# Patient Record
Sex: Female | Born: 1965 | Race: White | Hispanic: No | Marital: Married | Smoking: Former smoker
Health system: Southern US, Community
[De-identification: ages and names within clinical notes are randomized; demographics above are authoritative.]

## PROBLEM LIST (undated history)

## (undated) ENCOUNTER — Emergency Department (HOSPITAL_COMMUNITY): Payer: Self-pay | Source: Home / Self Care

## (undated) DIAGNOSIS — L03114 Cellulitis of left upper limb: Secondary | ICD-10-CM

## (undated) DIAGNOSIS — F32A Depression, unspecified: Secondary | ICD-10-CM

## (undated) DIAGNOSIS — F419 Anxiety disorder, unspecified: Secondary | ICD-10-CM

## (undated) DIAGNOSIS — I10 Essential (primary) hypertension: Secondary | ICD-10-CM

## (undated) DIAGNOSIS — K219 Gastro-esophageal reflux disease without esophagitis: Secondary | ICD-10-CM

## (undated) DIAGNOSIS — F329 Major depressive disorder, single episode, unspecified: Secondary | ICD-10-CM

## (undated) HISTORY — PX: NO PAST SURGERIES: SHX2092

---
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
## 1997-09-28 ENCOUNTER — Encounter: Admission: RE | Admit: 1997-09-28 | Discharge: 1997-09-28 | Payer: Self-pay | Admitting: Sports Medicine

## 1998-02-22 ENCOUNTER — Emergency Department (HOSPITAL_COMMUNITY): Admission: EM | Admit: 1998-02-22 | Discharge: 1998-02-22 | Payer: Self-pay | Admitting: Emergency Medicine

## 1998-02-22 ENCOUNTER — Encounter: Payer: Self-pay | Admitting: Emergency Medicine

## 1998-05-20 ENCOUNTER — Emergency Department (HOSPITAL_COMMUNITY): Admission: EM | Admit: 1998-05-20 | Discharge: 1998-05-20 | Payer: Self-pay | Admitting: Emergency Medicine

## 1998-08-19 ENCOUNTER — Encounter: Admission: RE | Admit: 1998-08-19 | Discharge: 1998-08-19 | Payer: Self-pay | Admitting: Family Medicine

## 1998-11-11 ENCOUNTER — Emergency Department (HOSPITAL_COMMUNITY): Admission: EM | Admit: 1998-11-11 | Discharge: 1998-11-11 | Payer: Self-pay | Admitting: Emergency Medicine

## 1998-12-26 ENCOUNTER — Encounter: Admission: RE | Admit: 1998-12-26 | Discharge: 1998-12-26 | Payer: Self-pay | Admitting: Family Medicine

## 1999-01-24 ENCOUNTER — Encounter: Admission: RE | Admit: 1999-01-24 | Discharge: 1999-01-24 | Payer: Self-pay | Admitting: Family Medicine

## 1999-04-05 ENCOUNTER — Encounter: Payer: Self-pay | Admitting: *Deleted

## 1999-04-05 ENCOUNTER — Emergency Department (HOSPITAL_COMMUNITY): Admission: EM | Admit: 1999-04-05 | Discharge: 1999-04-05 | Payer: Self-pay | Admitting: *Deleted

## 2000-01-05 ENCOUNTER — Emergency Department (HOSPITAL_COMMUNITY): Admission: EM | Admit: 2000-01-05 | Discharge: 2000-01-05 | Payer: Self-pay | Admitting: Emergency Medicine

## 2001-03-18 ENCOUNTER — Encounter: Admission: RE | Admit: 2001-03-18 | Discharge: 2001-03-18 | Payer: Self-pay | Admitting: Family Medicine

## 2001-03-18 ENCOUNTER — Encounter (INDEPENDENT_AMBULATORY_CARE_PROVIDER_SITE_OTHER): Payer: Self-pay | Admitting: Specialist

## 2003-11-21 ENCOUNTER — Emergency Department (HOSPITAL_COMMUNITY): Admission: EM | Admit: 2003-11-21 | Discharge: 2003-11-21 | Payer: Self-pay | Admitting: Emergency Medicine

## 2003-12-25 ENCOUNTER — Emergency Department (HOSPITAL_COMMUNITY): Admission: EM | Admit: 2003-12-25 | Discharge: 2003-12-25 | Payer: Self-pay | Admitting: Emergency Medicine

## 2005-11-09 ENCOUNTER — Encounter: Payer: Self-pay | Admitting: Family Medicine

## 2005-11-09 ENCOUNTER — Ambulatory Visit: Payer: Self-pay | Admitting: Family Medicine

## 2005-11-09 ENCOUNTER — Encounter (INDEPENDENT_AMBULATORY_CARE_PROVIDER_SITE_OTHER): Payer: Self-pay | Admitting: *Deleted

## 2005-11-09 LAB — CONVERTED CEMR LAB

## 2005-12-02 ENCOUNTER — Ambulatory Visit (HOSPITAL_COMMUNITY): Admission: RE | Admit: 2005-12-02 | Discharge: 2005-12-02 | Payer: Self-pay | Admitting: Family Medicine

## 2006-04-08 DIAGNOSIS — K644 Residual hemorrhoidal skin tags: Secondary | ICD-10-CM | POA: Insufficient documentation

## 2006-04-08 DIAGNOSIS — K649 Unspecified hemorrhoids: Secondary | ICD-10-CM | POA: Insufficient documentation

## 2006-04-08 DIAGNOSIS — G5600 Carpal tunnel syndrome, unspecified upper limb: Secondary | ICD-10-CM

## 2006-04-08 DIAGNOSIS — K625 Hemorrhage of anus and rectum: Secondary | ICD-10-CM

## 2006-04-08 DIAGNOSIS — F329 Major depressive disorder, single episode, unspecified: Secondary | ICD-10-CM | POA: Insufficient documentation

## 2006-04-08 DIAGNOSIS — M545 Low back pain: Secondary | ICD-10-CM | POA: Insufficient documentation

## 2006-04-09 ENCOUNTER — Encounter (INDEPENDENT_AMBULATORY_CARE_PROVIDER_SITE_OTHER): Payer: Self-pay | Admitting: *Deleted

## 2006-07-29 ENCOUNTER — Emergency Department (HOSPITAL_COMMUNITY): Admission: EM | Admit: 2006-07-29 | Discharge: 2006-07-29 | Payer: Self-pay | Admitting: Emergency Medicine

## 2006-08-26 ENCOUNTER — Encounter (INDEPENDENT_AMBULATORY_CARE_PROVIDER_SITE_OTHER): Payer: Self-pay | Admitting: Family Medicine

## 2006-08-26 ENCOUNTER — Ambulatory Visit: Payer: Self-pay | Admitting: Family Medicine

## 2006-08-26 ENCOUNTER — Telehealth: Payer: Self-pay | Admitting: *Deleted

## 2006-08-26 LAB — CONVERTED CEMR LAB
Basophils Relative: 0 % (ref 0–1)
Bilirubin Urine: NEGATIVE
Eosinophils Absolute: 0.1 10*3/uL (ref 0.0–0.7)
Eosinophils Relative: 1 % (ref 0–5)
Glucose, Urine, Semiquant: NEGATIVE
Ketones, urine, test strip: NEGATIVE
MCHC: 31.8 g/dL (ref 30.0–36.0)
MCV: 98.3 fL (ref 78.0–100.0)
Neutrophils Relative %: 71 % (ref 43–77)
Nitrite: NEGATIVE
Platelets: 294 10*3/uL (ref 150–400)
RBC / HPF: >25
RDW: 12.7 % (ref 11.5–14.0)
Urobilinogen, UA: 0.2

## 2006-08-30 ENCOUNTER — Telehealth (INDEPENDENT_AMBULATORY_CARE_PROVIDER_SITE_OTHER): Payer: Self-pay | Admitting: Family Medicine

## 2006-09-01 ENCOUNTER — Encounter (INDEPENDENT_AMBULATORY_CARE_PROVIDER_SITE_OTHER): Payer: Self-pay | Admitting: *Deleted

## 2007-06-27 ENCOUNTER — Encounter: Payer: Self-pay | Admitting: Family Medicine

## 2007-06-27 ENCOUNTER — Ambulatory Visit: Payer: Self-pay | Admitting: Family Medicine

## 2007-06-27 DIAGNOSIS — N898 Other specified noninflammatory disorders of vagina: Secondary | ICD-10-CM | POA: Insufficient documentation

## 2007-06-27 DIAGNOSIS — N949 Unspecified condition associated with female genital organs and menstrual cycle: Secondary | ICD-10-CM | POA: Insufficient documentation

## 2007-06-27 DIAGNOSIS — E049 Nontoxic goiter, unspecified: Secondary | ICD-10-CM | POA: Insufficient documentation

## 2007-06-27 LAB — CONVERTED CEMR LAB
LDL Cholesterol: 76 mg/dL (ref 0–99)
TSH: 1.451 microintl units/mL (ref 0.350–5.50)
Total CHOL/HDL Ratio: 2.8
Whiff Test: NEGATIVE

## 2007-07-08 ENCOUNTER — Ambulatory Visit (HOSPITAL_COMMUNITY): Admission: RE | Admit: 2007-07-08 | Discharge: 2007-07-08 | Payer: Self-pay | Admitting: Family Medicine

## 2007-07-25 ENCOUNTER — Encounter: Admission: RE | Admit: 2007-07-25 | Discharge: 2007-07-25 | Payer: Self-pay | Admitting: Family Medicine

## 2007-08-03 ENCOUNTER — Encounter: Admission: RE | Admit: 2007-08-03 | Discharge: 2007-08-03 | Payer: Self-pay | Admitting: Family Medicine

## 2007-09-14 ENCOUNTER — Ambulatory Visit: Payer: Self-pay | Admitting: Family Medicine

## 2007-09-20 ENCOUNTER — Emergency Department (HOSPITAL_COMMUNITY): Admission: EM | Admit: 2007-09-20 | Discharge: 2007-09-20 | Payer: Self-pay | Admitting: Emergency Medicine

## 2007-09-22 ENCOUNTER — Ambulatory Visit: Payer: Self-pay | Admitting: Family Medicine

## 2007-09-23 ENCOUNTER — Ambulatory Visit: Payer: Self-pay | Admitting: Family Medicine

## 2007-10-10 ENCOUNTER — Telehealth: Payer: Self-pay | Admitting: *Deleted

## 2008-03-27 ENCOUNTER — Telehealth: Payer: Self-pay | Admitting: Family Medicine

## 2008-03-27 ENCOUNTER — Ambulatory Visit: Payer: Self-pay | Admitting: Family Medicine

## 2008-04-03 ENCOUNTER — Telehealth (INDEPENDENT_AMBULATORY_CARE_PROVIDER_SITE_OTHER): Payer: Self-pay | Admitting: Family Medicine

## 2008-06-11 ENCOUNTER — Emergency Department (HOSPITAL_COMMUNITY): Admission: EM | Admit: 2008-06-11 | Discharge: 2008-06-11 | Payer: Self-pay | Admitting: Emergency Medicine

## 2008-12-28 ENCOUNTER — Other Ambulatory Visit: Admission: RE | Admit: 2008-12-28 | Discharge: 2008-12-28 | Payer: Self-pay | Admitting: Family Medicine

## 2008-12-28 ENCOUNTER — Ambulatory Visit: Payer: Self-pay | Admitting: Family Medicine

## 2008-12-28 LAB — CONVERTED CEMR LAB: Whiff Test: NEGATIVE

## 2009-01-01 ENCOUNTER — Encounter: Payer: Self-pay | Admitting: Family Medicine

## 2009-01-01 LAB — CONVERTED CEMR LAB
ALT: 17 units/L (ref 0–35)
Albumin: 4.7 g/dL (ref 3.5–5.2)
CO2: 24 meq/L (ref 19–32)
Calcium: 9.6 mg/dL (ref 8.4–10.5)
Chloride: 101 meq/L (ref 96–112)
Creatinine, Ser: 0.69 mg/dL (ref 0.40–1.20)
Potassium: 3.9 meq/L (ref 3.5–5.3)
Sodium: 137 meq/L (ref 135–145)
TSH: 2.254 microintl units/mL (ref 0.350–4.500)
Total Protein: 7.6 g/dL (ref 6.0–8.3)

## 2009-01-11 ENCOUNTER — Encounter: Admission: RE | Admit: 2009-01-11 | Discharge: 2009-01-11 | Payer: Self-pay | Admitting: Family Medicine

## 2009-01-31 IMAGING — US US PELVIS COMPLETE MODIFY
1 series · 14 of 25 positions shown · non-contrast
Comparison: None

CLINICAL DATA: Left pelvic discomfort.

TRANSABDOMINAL AND TRANSVAGINAL ULTRASOUND OF PELVIS
TECHNIQUE: Both transabdominal and transvaginal ultrasound
examinations of the pelvis were performed including evaluation of
the uterus, ovaries, adnexal regions, and pelvic cul-de-sac.

[Series 1: unknown · 0.30mm/px · 14 of 57 slices shown]
[im 1/57]
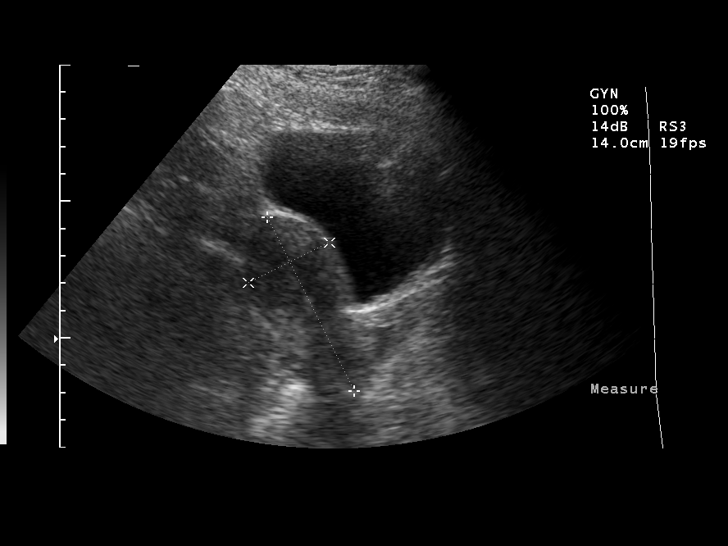
[im 5/57]
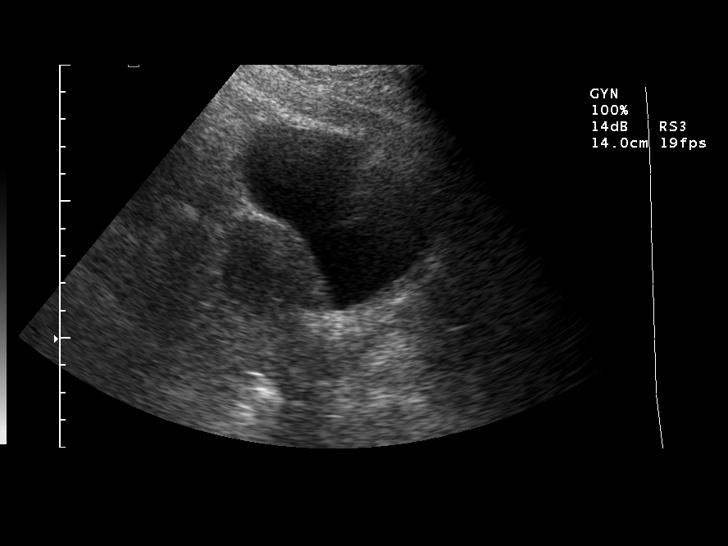
[im 10/57]
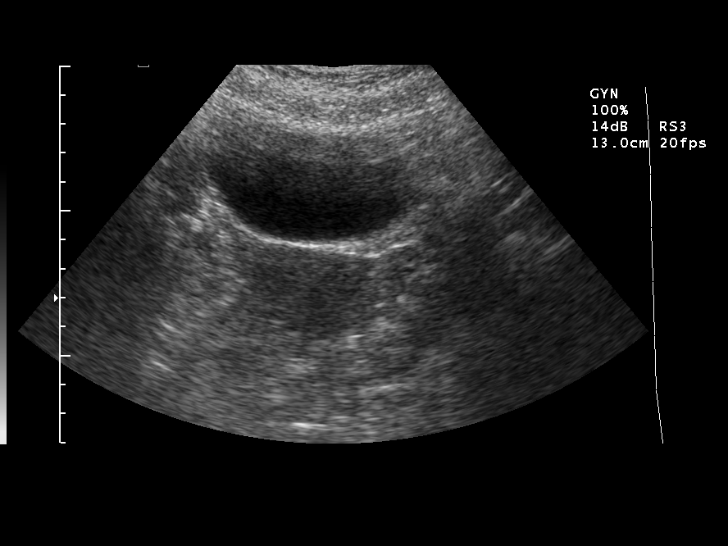
[im 15/57]
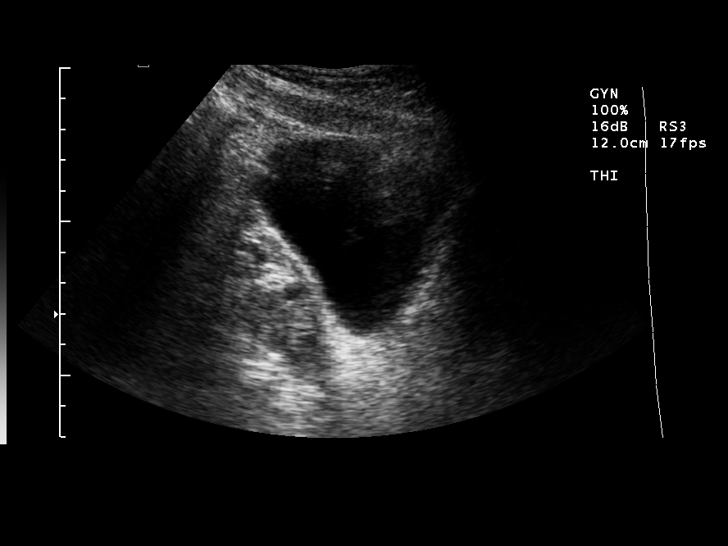
[im 19/57]
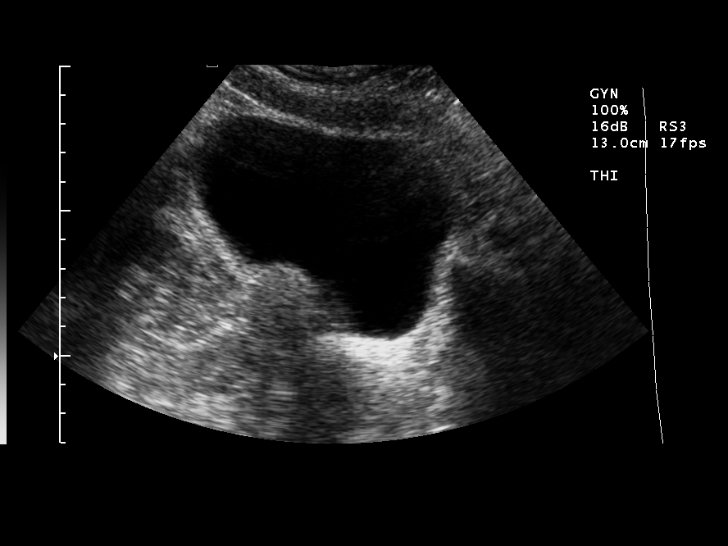
[im 22/57]
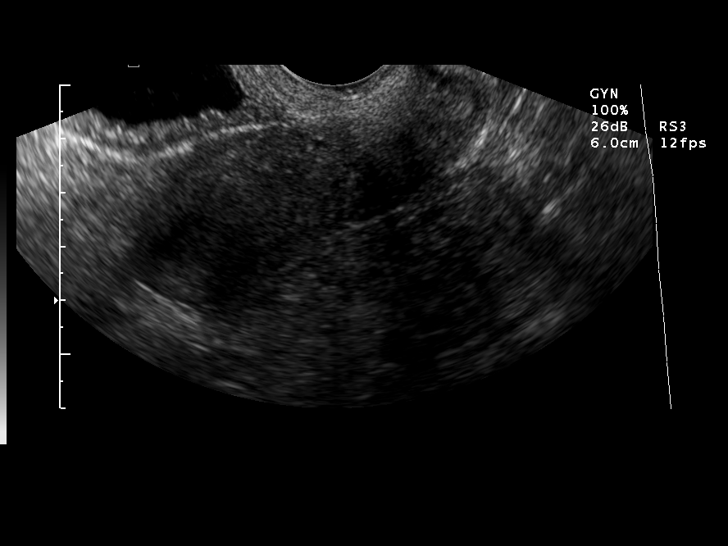
[im 26/57]
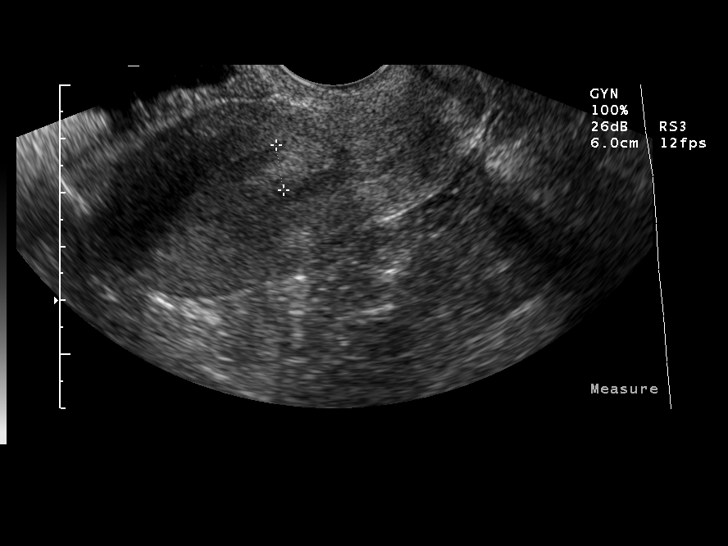
[im 31/57]
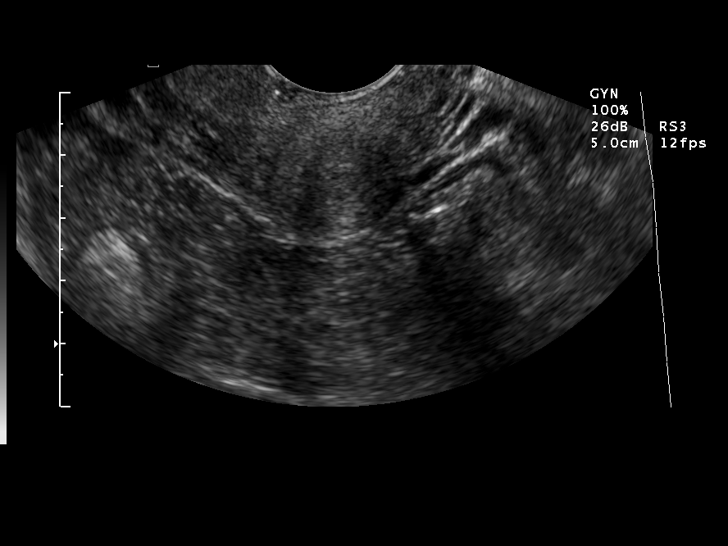
[im 36/57]
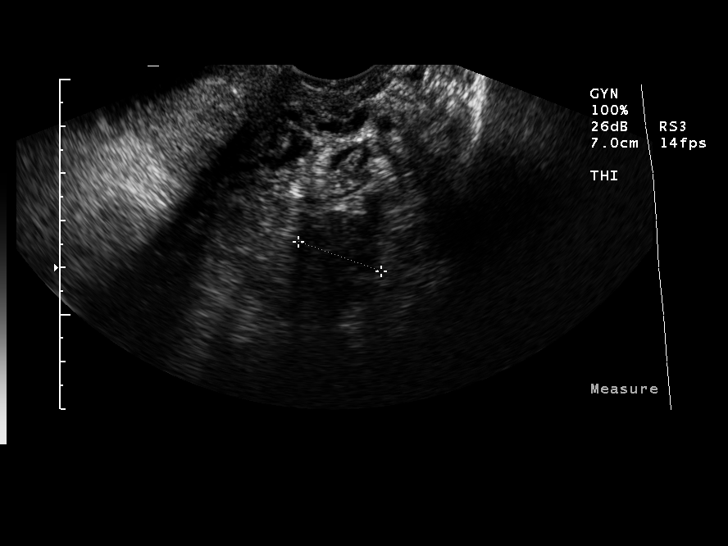
[im 38/57]
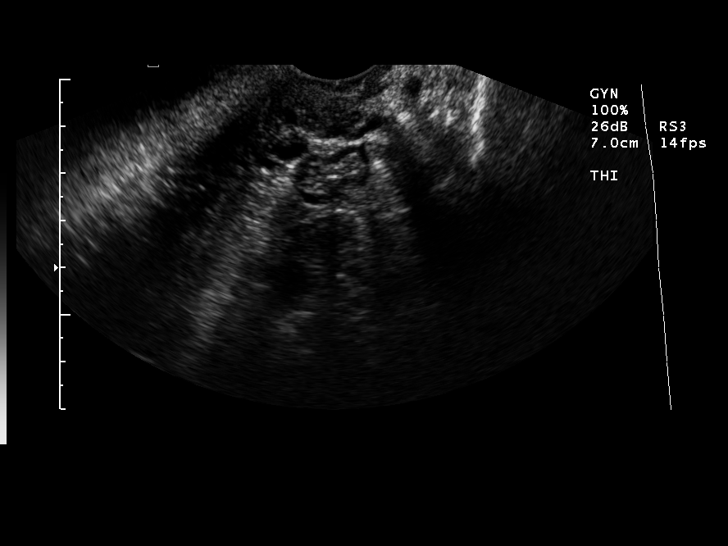
[im 43/57]
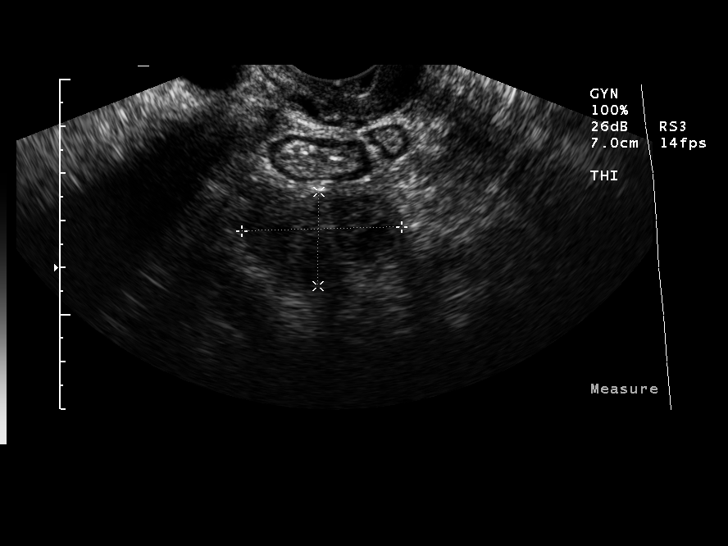
[im 47/57]
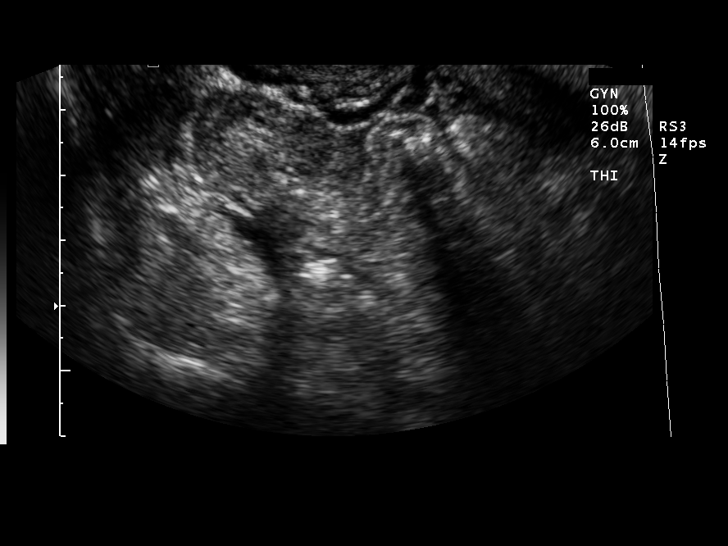
[im 52/57]
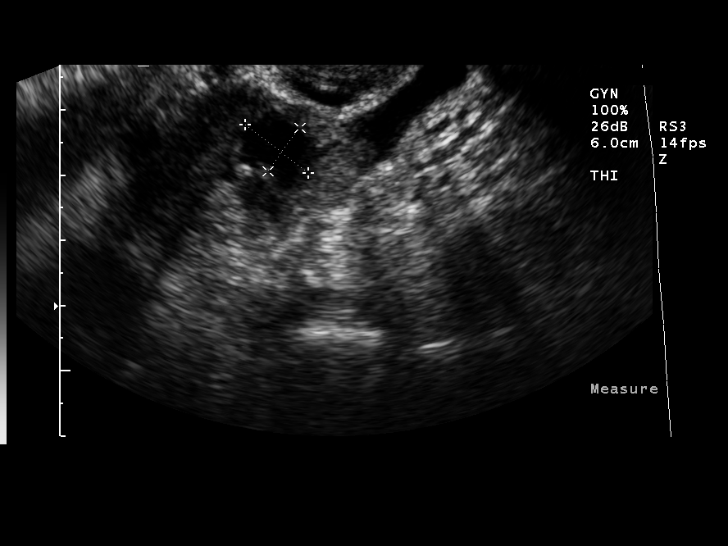
[im 57/57]
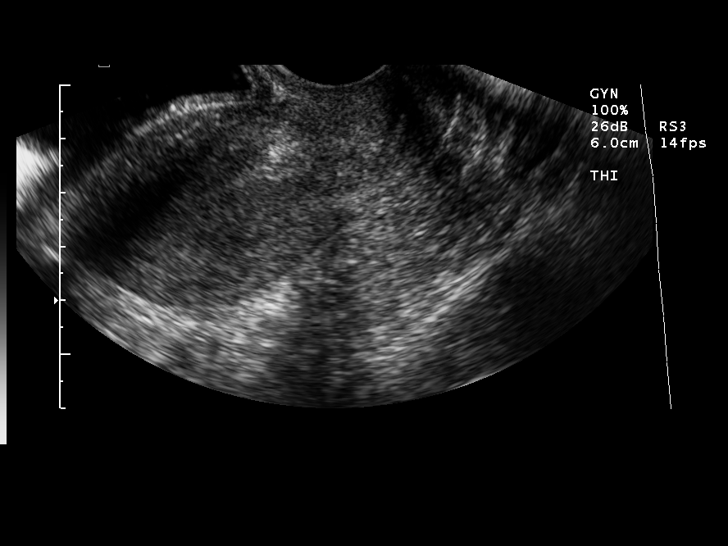

[14 of 25 positions shown; findings below may reference images not displayed]

FINDINGS: Uterus measures 7.4 x 3.4 x 4.4 cm.  Endometrial stripe
measures 3 mm in the uterine fundus and 9 mm in the lower uterine
segment.  Ovaries are unremarkable with a follicle on the right.
Small amount of free fluid.
IMPRESSION: 1.  Endometrial stripe is within normal limits for size.  Focal
prominence in the lower uterine segment is of questionable clinical
significance.  If there is a history of abnormal bleeding, sono
hysterography could be performed to evaluate for polyp, as
clinically indicated.
2.  Small free fluid.

## 2009-09-02 ENCOUNTER — Ambulatory Visit: Payer: Self-pay | Admitting: Family Medicine

## 2009-09-02 DIAGNOSIS — H9209 Otalgia, unspecified ear: Secondary | ICD-10-CM | POA: Insufficient documentation

## 2009-09-04 ENCOUNTER — Ambulatory Visit: Payer: Self-pay | Admitting: Family Medicine

## 2009-09-11 ENCOUNTER — Ambulatory Visit: Payer: Self-pay | Admitting: Family Medicine

## 2009-11-06 ENCOUNTER — Ambulatory Visit: Payer: Self-pay | Admitting: Family Medicine

## 2010-03-02 ENCOUNTER — Encounter: Payer: Self-pay | Admitting: Family Medicine

## 2010-03-11 NOTE — Assessment & Plan Note (Signed)
Summary: read ppd/bmc  Nurse Visit   Allergies: No Known Drug Allergies  PPD Results    Date of reading: 09/04/2009    Results: 0 mm    Interpretation: negative  Orders Added: 1)  No Charge Patient Arrived (NCPA0) [NCPA0]   BP was elevated at office visit on 09/02/2009 and patient requested recheck today.  BP checked manually using regular  adult cuff LA 120/88 RA 120/84. Brinda Focht RN  September 04, 2009 11:04 AM  Noted Doralee Albino MD  September 04, 2009 11:07 AM
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
## 2010-03-11 NOTE — Assessment & Plan Note (Signed)
Summary: TB TEST/EO  Nurse Visit   Allergies: No Known Drug Allergies  patient comes in today on nurse schedule for  PPD. she reports right ear discomfort, sore to touch, popping and stopped up sensation. appointment scheduled with Dr. Janalyn Harder in [facility] at 11:15. patient agreeable. Treacy Holcomb RN  September 02, 2009 10:12 AM   Immunizations Administered:  PPD Skin Test:    Vaccine Type: PPD    Site: right forearm    Mfr: Sanofi Pasteur    Dose: 0.1 ml    Route: ID    Given by: Theresia Lo RN    Exp. Date: 11/22/2010    Lot #: [id number]  Orders Added: 1)  TB Skin Test [86580] 2)  Admin 1st Vaccine [90471]  Appended Document: TB TEST/EO above was nurse visit and not an office visit. new  document was opened for office visit.
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
## 2010-03-11 NOTE — Assessment & Plan Note (Signed)
Summary: right ear discomfort, stopped up and popping/ls   Vital Signs:  Patient profile:   45 year old female Weight:      136.6 pounds Temp:     98.0 degrees F Pulse rate:   74 / minute BP sitting:   153 / 102  (left arm)  Vitals Entered By: Angeline Slim MD (September 02, 2009 11:03 AM) CC: ear pain Is Patient Diabetic? No Pain Assessment Patient in pain? yes     Location: rt ear Intensity: 3-10   Primary Care Provider:  Doralee Albino MD  CC:  ear pain.  History of Present Illness: 44 y/o F with   Intermitten R ear pain x 3 wks.  3 wks ago went swimming [employer].  Right afterwards feels like ear is popping.  Feels very painful.  Her mom had some ear drops so her mom gave her some drops.  She used this twice.  She had relief for about 1 week, but pain returned.  No ear discharge.  No fever, chills.  No HAs,  No nausea/vomiting.  Sore throat yesterday and now painful to swallow.  Pain kept her up last night.   Left ear with no pain.   Tried Aleve, which gave some relief, but pain is still there.    Habits & Providers  Alcohol-Tobacco-Diet     Tobacco Status: quit     Year Quit: 2004  Current Medications (verified): 1)  Zolpidem Tartrate 10 Mg  Tabs (Zolpidem Tartrate) .Marland Kitchen.. 1 By Mouth At [facility] Prn 2)  Cipro Hc 0.2-1 % Susp (Ciprofloxacin-Hydrocortisone) .... Use 3 Drops in Each Ear Twice A Day For 7 Days. 1 Bottle. 3)  Mobic 7.5 Mg Tabs (Meloxicam) .Marland Kitchen.. 1 Tab By Mouth Daily For Pain  Allergies (verified): No Known Drug Allergies  Social History: Smoking Status:  quit  Review of Systems General:  Denies chills and fever. ENT:  Complains of earache; denies decreased hearing, nasal congestion, ringing in ears, and sore throat. Resp:  Denies cough, coughing up blood, and wheezing.  Physical Exam  General:  Well-developed,well-nourished,in no acute distress; alert,appropriate and cooperative throughout examination. vitals reviewed.   Eyes:  perrla, eomi Ears:  R ear: cloudy  canals, some inflammation, external ear pain with elevation.  No evidence of fungal infection.   L ear: cloudy canals, no inflammation, no pain Mouth:  Oral mucosa and oropharynx without lesions or exudates.     Impression & Recommendations:  Problem # 1:  EAR PAIN, LEFT (ICD-388.70) Likely otitis externa.  Pt without history of Diabetes.  Mild in left ear.  Moderate in right ear.  Will treat with Cipro otic bid x 7days.  For pain will give mobic , as NSAID is recommended for pain.  Pt to rtc if not better.  Handout given.  Recommended avoidance of water.   Her updated medication list for this problem includes:    Cipro Hc 0.2-1 % Susp (Ciprofloxacin-hydrocortisone) ..... Use 3 drops in each ear twice a day for 7 days. 1 bottle.  Orders: [facility]- Est Level  3 ([address]109)  Complete Medication List: 1)  Zolpidem Tartrate 10 Mg Tabs (Zolpidem tartrate) .Marland Kitchen.. 1 by mouth at hs prn 2)  Cipro Hc 0.2-1 % Susp (Ciprofloxacin-hydrocortisone) .... Use 3 drops in each ear twice a day for 7 days. 1 bottle. 3)  Mobic 7.5 Mg Tabs (Meloxicam) .Marland Kitchen.. 1 tab by mouth daily for pain  Patient Instructions: 1)  Please schedule a follow-up appointment in 1 week if not  better.  2)  You have an ear infection.  Use the Cipro drops twice a day for 3 days. 3)  You can take Mobic once daily for pain. Prescriptions: MOBIC 7.5 MG TABS (MELOXICAM) 1 tab by mouth daily for pain  #30 x 0   Entered and Authorized by:   Angeline Slim MD   Signed by:   Angeline Slim MD on 09/02/2009   Method used:   Electronically to        [facility]  [phone]* (retail)       [address]       [place], [place]  [address]       Ph: [phone] or [phone]       Fax: [phone]   RxID:   [id number] CIPRO HC 0.2-1 % SUSP (CIPROFLOXACIN-HYDROCORTISONE) Use 3 drops in each ear twice a day for 7 days. 1 bottle.  #1 x 0   Entered and Authorized by:   Angeline Slim MD   Signed by:   Angeline Slim MD on 09/02/2009   Method used:    Electronically to        [facility]  [phone]* (retail)       [address]       [place], [place]  [address]       Ph: [phone] or [phone]       Fax: [phone]   RxID:   [id number]

## 2010-07-03 ENCOUNTER — Other Ambulatory Visit: Payer: Self-pay | Admitting: Family Medicine

## 2010-07-03 DIAGNOSIS — Z1231 Encounter for screening mammogram for malignant neoplasm of breast: Secondary | ICD-10-CM

## 2010-07-15 ENCOUNTER — Ambulatory Visit
Admission: RE | Admit: 2010-07-15 | Discharge: 2010-07-15 | Disposition: A | Discharge status: Home / Self Care | Payer: Self-pay | Source: Ambulatory Visit | Attending: Family Medicine | Admitting: Family Medicine

## 2010-07-15 DIAGNOSIS — Z1231 Encounter for screening mammogram for malignant neoplasm of breast: Secondary | ICD-10-CM

## 2010-08-06 ENCOUNTER — Ambulatory Visit (INDEPENDENT_AMBULATORY_CARE_PROVIDER_SITE_OTHER): Payer: Self-pay | Admitting: Family Medicine

## 2010-08-06 DIAGNOSIS — Z0000 Encounter for general adult medical examination without abnormal findings: Secondary | ICD-10-CM

## 2010-08-07 NOTE — Progress Notes (Signed)
  Subjective:    Patient ID: Carmen Garcia, female    DOB: 10/31/1965, 45 y.o.   MRN: [id number]  HPI After speaking with nurse and finding out not due for pap, patient decided visit not needed, so left without being seen.    Review of Systems     Objective:   Physical Exam        Assessment & Plan:

## 2010-09-28 ENCOUNTER — Ambulatory Visit (INDEPENDENT_AMBULATORY_CARE_PROVIDER_SITE_OTHER): Payer: Self-pay

## 2010-09-28 ENCOUNTER — Inpatient Hospital Stay (INDEPENDENT_AMBULATORY_CARE_PROVIDER_SITE_OTHER)
Admission: RE | Admit: 2010-09-28 | Discharge: 2010-09-28 | Disposition: A | Discharge status: Home / Self Care | Payer: Self-pay | Source: Ambulatory Visit | Attending: Emergency Medicine | Admitting: Emergency Medicine

## 2010-09-28 DIAGNOSIS — S9780XA Crushing injury of unspecified foot, initial encounter: Secondary | ICD-10-CM

## 2010-10-29 ENCOUNTER — Ambulatory Visit: Payer: Self-pay

## 2010-11-04 ENCOUNTER — Ambulatory Visit (INDEPENDENT_AMBULATORY_CARE_PROVIDER_SITE_OTHER): Payer: Self-pay | Admitting: *Deleted

## 2010-11-04 DIAGNOSIS — Z23 Encounter for immunization: Secondary | ICD-10-CM

## 2010-11-07 LAB — CULTURE, ROUTINE-ABSCESS

## 2010-12-10 ENCOUNTER — Other Ambulatory Visit (HOSPITAL_COMMUNITY)
Admission: RE | Admit: 2010-12-10 | Discharge: 2010-12-10 | Disposition: A | Discharge status: Home / Self Care | Payer: Self-pay | Source: Ambulatory Visit | Attending: Family Medicine | Admitting: Family Medicine

## 2010-12-10 ENCOUNTER — Encounter: Payer: Self-pay | Admitting: Family Medicine

## 2010-12-10 ENCOUNTER — Ambulatory Visit (INDEPENDENT_AMBULATORY_CARE_PROVIDER_SITE_OTHER): Payer: Self-pay | Admitting: Family Medicine

## 2010-12-10 VITALS — BP 119/82 | HR 69 | Temp 98.4°F | Ht 65.0 in | Wt 147.0 lb

## 2010-12-10 DIAGNOSIS — F329 Major depressive disorder, single episode, unspecified: Secondary | ICD-10-CM

## 2010-12-10 DIAGNOSIS — Z8742 Personal history of other diseases of the female genital tract: Secondary | ICD-10-CM | POA: Insufficient documentation

## 2010-12-10 DIAGNOSIS — Z124 Encounter for screening for malignant neoplasm of cervix: Secondary | ICD-10-CM

## 2010-12-10 DIAGNOSIS — Z01419 Encounter for gynecological examination (general) (routine) without abnormal findings: Secondary | ICD-10-CM | POA: Insufficient documentation

## 2010-12-10 DIAGNOSIS — Z1211 Encounter for screening for malignant neoplasm of colon: Secondary | ICD-10-CM | POA: Insufficient documentation

## 2010-12-10 DIAGNOSIS — Z13220 Encounter for screening for lipoid disorders: Secondary | ICD-10-CM

## 2010-12-10 DIAGNOSIS — G4700 Insomnia, unspecified: Secondary | ICD-10-CM | POA: Insufficient documentation

## 2010-12-10 MED ORDER — ZOLPIDEM TARTRATE 10 MG PO TABS: 10.0000 mg | ORAL_TABLET | Freq: Every evening | ORAL | Status: AC | PRN

## 2010-12-10 NOTE — Assessment & Plan Note (Addendum)
Sleep hygeine handout given.  Also, trial of Ambien

## 2010-12-11 LAB — LIPID PANEL
HDL: 65 mg/dL (ref >39)
Triglycerides: 174 mg/dL — ABNORMAL HIGH (ref <150)

## 2010-12-11 NOTE — Assessment & Plan Note (Signed)
If this pap is normal, will likely go to q3y screening.

## 2010-12-11 NOTE — Patient Instructions (Signed)
I will call with lab results.   Please review the sleep hygiene handout.

## 2010-12-11 NOTE — Assessment & Plan Note (Signed)
Markedly improved 

## 2010-12-11 NOTE — Progress Notes (Signed)
  Subjective:    Patient ID: Carmen Garcia, female    DOB: August 19, 1965, 45 y.o.   MRN: [id number]  HPI Main reason for visit is annual pap smear (2 years.)  She has a remote hx of abnormal pap but all normal for past 10 years.  She is monogamous.  C/O recent metrorhhagia.  Mother went through menopause at age 10.  No other vaginal or breast complaints Depression markedly improved.  Her situation is much better.  She has reconciled with her longstanding boyfriend.   Insomnia remains a problem.  While she is generally drug avoidant, she would like to try something for sleep. No other complaints.    Review of Systems     Objective:   Physical Exam Abd benign Pelvic, WNL, pap taken.  No uterine enlargement.  Adnexa not tender.        Assessment & Plan:

## 2010-12-11 NOTE — Assessment & Plan Note (Signed)
Due for measure

## 2010-12-12 ENCOUNTER — Encounter: Payer: Self-pay | Admitting: Family Medicine

## 2013-03-15 ENCOUNTER — Encounter: Payer: Self-pay | Admitting: Family Medicine

## 2013-03-15 ENCOUNTER — Ambulatory Visit (INDEPENDENT_AMBULATORY_CARE_PROVIDER_SITE_OTHER): Payer: Self-pay | Admitting: Family Medicine

## 2013-03-15 VITALS — BP 152/98 | HR 85 | Temp 98.0°F | Ht 67.0 in | Wt 164.2 lb

## 2013-03-15 DIAGNOSIS — Z23 Encounter for immunization: Secondary | ICD-10-CM

## 2013-03-15 DIAGNOSIS — I119 Hypertensive heart disease without heart failure: Secondary | ICD-10-CM | POA: Insufficient documentation

## 2013-03-15 DIAGNOSIS — F329 Major depressive disorder, single episode, unspecified: Secondary | ICD-10-CM

## 2013-03-15 DIAGNOSIS — F3289 Other specified depressive episodes: Secondary | ICD-10-CM

## 2013-03-15 MED ORDER — NORTRIPTYLINE HCL 50 MG PO CAPS: ORAL_CAPSULE | ORAL | Status: DC

## 2013-03-15 MED ORDER — HYDROCHLOROTHIAZIDE 12.5 MG PO TABS: 12.5000 mg | ORAL_TABLET | Freq: Every day | ORAL | Status: DC

## 2013-03-15 NOTE — Patient Instructions (Signed)
The hydrochlorothiazide is for high blood pressure.  Also lay off the salt, even the sea salt. The usual dose of nortriptyline is what I have prescribed you.  Some people take less.  And I want to slowly build you up to the full dose. So, the bottle instructions are for the full dose. Start with one cap each night for 1 week Then one cap twice a day for a week Then go to full dose provided no side effects. You will need to be on the full dose for 4-6 weeks before we can say its not working.  Do not quit before you see me again in two months.   I will do pap, blood work and mammogram when you get your insurance figured out.
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
## 2013-03-16 NOTE — Assessment & Plan Note (Signed)
New, start HCTZ

## 2013-03-16 NOTE — Assessment & Plan Note (Signed)
Certainly worse during this time of stress.  Recommended counseling and I started nortriptyline.  FU one month.

## 2014-12-23 ENCOUNTER — Encounter (HOSPITAL_COMMUNITY): Payer: Self-pay | Admitting: Cardiology

## 2014-12-23 ENCOUNTER — Emergency Department (HOSPITAL_COMMUNITY)
Admission: EM | Admit: 2014-12-23 | Discharge: 2014-12-23 | Disposition: A | Discharge status: Home / Self Care | Payer: Self-pay | Attending: Emergency Medicine | Admitting: Emergency Medicine

## 2014-12-23 ENCOUNTER — Emergency Department (HOSPITAL_COMMUNITY): Payer: Self-pay

## 2014-12-23 DIAGNOSIS — N10 Acute tubulo-interstitial nephritis: Secondary | ICD-10-CM | POA: Insufficient documentation

## 2014-12-23 DIAGNOSIS — Z79899 Other long term (current) drug therapy: Secondary | ICD-10-CM | POA: Insufficient documentation

## 2014-12-23 DIAGNOSIS — Z87891 Personal history of nicotine dependence: Secondary | ICD-10-CM | POA: Insufficient documentation

## 2014-12-23 DIAGNOSIS — N12 Tubulo-interstitial nephritis, not specified as acute or chronic: Secondary | ICD-10-CM

## 2014-12-23 DIAGNOSIS — N23 Unspecified renal colic: Secondary | ICD-10-CM | POA: Insufficient documentation

## 2014-12-23 LAB — CBC WITH DIFFERENTIAL/PLATELET
BASOS ABS: 0 10*3/uL (ref 0.0–0.1)
BASOS PCT: 0 %
EOS ABS: 0.1 10*3/uL (ref 0.0–0.7)
EOS PCT: 1 %
HCT: 47.1 % — ABNORMAL HIGH (ref 36.0–46.0)
Hemoglobin: 15.9 g/dL — ABNORMAL HIGH (ref 12.0–15.0)
LYMPHS PCT: 16 %
Lymphs Abs: 3.3 10*3/uL (ref 0.7–4.0)
MCH: 34 pg (ref 26.0–34.0)
MCHC: 33.8 g/dL (ref 30.0–36.0)
MCV: 100.9 fL — AB (ref 78.0–100.0)
MONO ABS: 1.3 10*3/uL — AB (ref 0.1–1.0)
Monocytes Relative: 7 %
Neutro Abs: 15.5 10*3/uL — ABNORMAL HIGH (ref 1.7–7.7)
Neutrophils Relative %: 76 %
PLATELETS: 332 10*3/uL (ref 150–400)
RBC: 4.67 MIL/uL (ref 3.87–5.11)
RDW: 12.2 % (ref 11.5–15.5)
WBC: 20.3 10*3/uL — ABNORMAL HIGH (ref 4.0–10.5)

## 2014-12-23 LAB — URINALYSIS, ROUTINE W REFLEX MICROSCOPIC
BILIRUBIN URINE: NEGATIVE
Glucose, UA: NEGATIVE mg/dL
KETONES UR: NEGATIVE mg/dL
NITRITE: POSITIVE — AB
PH: 6.5 (ref 5.0–8.0)
Protein, ur: >300 mg/dL — AB
Specific Gravity, Urine: 1.02 (ref 1.005–1.030)
Urobilinogen, UA: 2 mg/dL — ABNORMAL HIGH (ref 0.0–1.0)

## 2014-12-23 LAB — COMPREHENSIVE METABOLIC PANEL
ALBUMIN: 4.7 g/dL (ref 3.5–5.0)
ALT: 51 U/L (ref 14–54)
ANION GAP: 10 (ref 5–15)
AST: 33 U/L (ref 15–41)
Alkaline Phosphatase: 82 U/L (ref 38–126)
BILIRUBIN TOTAL: 1.1 mg/dL (ref 0.3–1.2)
BUN: 8 mg/dL (ref 6–20)
CO2: 23 mmol/L (ref 22–32)
Calcium: 9.6 mg/dL (ref 8.9–10.3)
Chloride: 103 mmol/L (ref 101–111)
Creatinine, Ser: 0.85 mg/dL (ref 0.44–1.00)
GFR calc Af Amer: >60 mL/min (ref >60)
GLUCOSE: 136 mg/dL — AB (ref 65–99)
POTASSIUM: 4 mmol/L (ref 3.5–5.1)
Sodium: 136 mmol/L (ref 135–145)
Total Protein: 8.2 g/dL — ABNORMAL HIGH (ref 6.5–8.1)

## 2014-12-23 LAB — URINE MICROSCOPIC-ADD ON

## 2014-12-23 MED ORDER — PHENAZOPYRIDINE HCL 200 MG PO TABS: 200.0000 mg | ORAL_TABLET | Freq: Three times a day (TID) | ORAL | Status: DC | PRN

## 2014-12-23 MED ORDER — CEFTRIAXONE SODIUM 1 G IJ SOLR: 1.0000 g | Freq: Once | INTRAMUSCULAR | Status: DC

## 2014-12-23 MED ORDER — ONDANSETRON HCL 4 MG/2ML IJ SOLN
4.0000 mg | Freq: Once | INTRAMUSCULAR | Status: AC
  Administered 2014-12-23: 4 mg via INTRAVENOUS
  Filled 2014-12-23: qty 2

## 2014-12-23 MED ORDER — HYDROMORPHONE HCL 1 MG/ML IJ SOLN
1.0000 mg | INTRAMUSCULAR | Status: DC | PRN
  Administered 2014-12-23: 1 mg via INTRAVENOUS
  Filled 2014-12-23: qty 1

## 2014-12-23 MED ORDER — SODIUM CHLORIDE 0.9 % IV SOLN
1000.0000 mL | INTRAVENOUS | Status: DC
  Administered 2014-12-23: 1000 mL via INTRAVENOUS

## 2014-12-23 MED ORDER — OXYCODONE-ACETAMINOPHEN 5-325 MG PO TABS: 1.0000 | ORAL_TABLET | ORAL | Status: DC | PRN

## 2014-12-23 MED ORDER — DEXTROSE 5 % IV SOLN
1.0000 g | Freq: Once | INTRAVENOUS | Status: AC
  Administered 2014-12-23: 1 g via INTRAVENOUS

## 2014-12-23 MED ORDER — CEFTRIAXONE SODIUM 1 G IJ SOLR
INTRAMUSCULAR | Status: AC
  Filled 2014-12-23: qty 10

## 2014-12-23 MED ORDER — HYDROMORPHONE HCL 1 MG/ML IJ SOLN
1.0000 mg | Freq: Once | INTRAMUSCULAR | Status: AC
  Administered 2014-12-23: 1 mg via INTRAVENOUS
  Filled 2014-12-23: qty 1

## 2014-12-23 MED ORDER — CEPHALEXIN 500 MG PO CAPS: 500.0000 mg | ORAL_CAPSULE | Freq: Four times a day (QID) | ORAL | Status: DC

## 2014-12-23 NOTE — ED Provider Notes (Signed)
CSN: [id number]     Arrival date & time 12/23/14  0756 History   First MD Initiated Contact with Patient 12/23/14 [phone]     Chief Complaint  Patient presents with  . Flank Pain  . Dysuria     (Consider location/radiation/quality/duration/timing/severity/associated sxs/prior Treatment) HPI   Carmen Garcia is a(n) 49 y.o. female who presents is a 49 y/o F who presents with CC of flank pain. The patient's sxs began 1 week ago with dysuria, urgendy and frequency. She has had intermittent relief with otc Azo- standard and Urostat. The patient states that 2 days ago her sxs worsened. SHe has R flank pain that radiates into her labia. She complains of severe colicky pain, nausea, and diaphoresis throughout the night. She has no previous hx of kidney stones. She is unsure if she has been running fever. She c/o intense pressure in her groin and severe stinging and burning.   History reviewed. No pertinent past medical history. History reviewed. No pertinent past surgical history. History reviewed. No pertinent family history. Social History  Substance Use Topics  . Smoking status: Former Games developermoker  . Smokeless tobacco: None  . Alcohol Use: No   OB History    No data available     Review of Systems  Ten systems reviewed and are negative for acute change, except as noted in the HPI.    Allergies  Review of patient's allergies indicates no known allergies.  Home Medications   Prior to Admission medications   Medication Sig Start Date End Date Taking? Authorizing Provider  cephALEXin (KEFLEX) 500 MG capsule Take 1 capsule (500 mg total) by mouth 4 (four) times daily. 12/23/14   Arthor CaptainAbigail Anwyn Kriegel, PA-C  hydrochlorothiazide (HYDRODIURIL) 12.5 MG tablet Take 1 tablet (12.5 mg total) by mouth daily. 03/15/13   Moses MannersWilliam A Hensel, MD  nortriptyline (PAMELOR) 50 MG capsule One cap every morning and two caps at bedtime. 03/15/13   Moses MannersWilliam A Hensel, MD  oxyCODONE-acetaminophen (PERCOCET) 5-325 MG tablet  Take 1-2 tablets by mouth every 4 (four) hours as needed. 12/23/14   Arthor CaptainAbigail Shahram Alexopoulos, PA-C  phenazopyridine (PYRIDIUM) 200 MG tablet Take 1 tablet (200 mg total) by mouth 3 (three) times daily as needed (pain with urination). 12/23/14   Denson Niccoli, PA-C   BP 110/88 mmHg  Pulse 91  Temp(Src) 98.4 F (36.9 C) (Oral)  Resp 18  Ht 5\' 6"  (1.676 m)  Wt 179 lb (81.194 kg)  BMI 28.91 kg/m2  SpO2 96%  LMP 11/24/2014 Physical Exam  Constitutional: She is oriented to person, place, and time. She appears well-developed and well-nourished. No distress.  Appears extremely uncomfortable.   HENT:  Head: Normocephalic and atraumatic.  Eyes: Conjunctivae are normal. No scleral icterus.  Neck: Normal range of motion.  Cardiovascular: Normal rate, regular rhythm and normal heart sounds.  Exam reveals no gallop and no friction rub.   No murmur heard. Pulmonary/Chest: Effort normal and breath sounds normal. No respiratory distress.  Abdominal: Soft. Bowel sounds are normal. She exhibits no distension and no mass. There is tenderness (Suprapubic). There is no guarding.  Neurological: She is alert and oriented to person, place, and time.  Skin: Skin is warm and dry. She is not diaphoretic.    ED Course  Procedures (including critical care time) Labs Review Labs Reviewed  CBC WITH DIFFERENTIAL/PLATELET - Abnormal; Notable for the following:    WBC 20.3 (*)    Hemoglobin 15.9 (*)    HCT 47.1 (*)    MCV  100.9 (*)    Neutro Abs 15.5 (*)    Monocytes Absolute 1.3 (*)    All other components within normal limits  URINALYSIS, ROUTINE W REFLEX MICROSCOPIC (NOT AT [facility]) - Abnormal; Notable for the following:    APPearance CLOUDY (*)    Hgb urine dipstick LARGE (*)    Protein, ur >300 (*)    Urobilinogen, UA 2.0 (*)    Nitrite POSITIVE (*)    Leukocytes, UA MODERATE (*)    All other components within normal limits  COMPREHENSIVE METABOLIC PANEL - Abnormal; Notable for the following:    Glucose,  Bld 136 (*)    Total Protein 8.2 (*)    All other components within normal limits  URINE MICROSCOPIC-ADD ON - Abnormal; Notable for the following:    Squamous Epithelial / LPF FEW (*)    Bacteria, UA MANY (*)    All other components within normal limits  I-STAT BETA HCG BLOOD, ED ([facility], [facility], [facility] ONLY)    Imaging Review Ct Renal Stone Study  12/23/2014  CLINICAL DATA:  Difficulty urinating last week. Right flank pain for 3 days. Gross hematuria. Initial encounter. EXAM: CT ABDOMEN AND PELVIS WITHOUT CONTRAST TECHNIQUE: Multidetector CT imaging of the abdomen and pelvis was performed following the standard protocol without IV contrast. COMPARISON:  None. FINDINGS: Mild atelectasis is seen in the lung bases. No pleural or pericardial effusion. There is mild right hydronephrosis with stranding about the right kidney and ureter but no urinary tract stones are identified on the right or left. Two right renal cysts are seen. The largest is in the upper pole measuring 1.9 cm in diameter. The left kidney is unremarkable. There is diffuse fatty infiltration of the liver without focal lesion. The gallbladder, adrenal glands, spleen and biliary tree are unremarkable. A single small calcification at the head neck junction of the pancreas may be due to prior inflammatory process. The pancreas is otherwise unremarkable. Uterus, adnexa and urinary bladder appear normal. The stomach, small and large bowel and appendix are unremarkable. Trace amount free pelvic fluid is seen. No lymphadenopathy. Mild aortic atherosclerosis is noted. No lytic or sclerotic bony lesion is seen. IMPRESSION: Mild right hydronephrosis and stranding about the right kidney and ureter may be secondary to recent stone passage or infectious process/pyelonephritis. No urinary tract stones are identified. Diffuse fatty infiltration of the liver. Electronically Signed   By: Drusilla Kanner M.D.   On: 12/23/2014 10:18   I have personally reviewed and  evaluated these images and lab results as part of my medical decision-making.   EKG Interpretation None      MDM   Final diagnoses:  Renal colic on right side  Pyelocystitis    Patient with acute pyelonephritis/ Tolerating PO fluids CT shows hydro/ureteronephrosis ? Recently passed stone Pain is greatly improved. i discussed the findings with Dr. Effie Shy who feels patient is safe for discharge. Given IV rocephin 1 g D/c with keflex, pain meds, pyridium Patient asked to f/u with hier pcp in the next week i have discussed reasons to return to the ED +leukocytosis The patient appears reasonably screened and/or stabilized for discharge and I doubt any other medical condition or other [facility] requiring further screening, evaluation, or treatment in the ED at this time prior to discharge.     Arthor Captain, PA-C 12/23/14 1659  Mancel Bale, MD 12/23/14 [phone]

## 2014-12-23 NOTE — ED Notes (Signed)
Bladder scan showed 20-60cc.

## 2014-12-23 NOTE — Discharge Instructions (Signed)

## 2014-12-23 NOTE — ED Notes (Signed)
Difficulty urinating last week.  Right flank pain since Friday.

## 2014-12-24 LAB — I-STAT BETA HCG BLOOD, ED (MC, WL, AP ONLY)

## 2015-12-13 ENCOUNTER — Encounter (HOSPITAL_COMMUNITY): Payer: Self-pay | Admitting: Emergency Medicine

## 2015-12-13 ENCOUNTER — Emergency Department (HOSPITAL_COMMUNITY): Payer: Self-pay

## 2015-12-13 ENCOUNTER — Emergency Department (HOSPITAL_COMMUNITY)
Admission: EM | Admit: 2015-12-13 | Discharge: 2015-12-13 | Disposition: A | Discharge status: Home / Self Care | Payer: Self-pay | Attending: Emergency Medicine | Admitting: Emergency Medicine

## 2015-12-13 DIAGNOSIS — Y999 Unspecified external cause status: Secondary | ICD-10-CM | POA: Insufficient documentation

## 2015-12-13 DIAGNOSIS — Y929 Unspecified place or not applicable: Secondary | ICD-10-CM | POA: Insufficient documentation

## 2015-12-13 DIAGNOSIS — F17210 Nicotine dependence, cigarettes, uncomplicated: Secondary | ICD-10-CM | POA: Insufficient documentation

## 2015-12-13 DIAGNOSIS — W010XXA Fall on same level from slipping, tripping and stumbling without subsequent striking against object, initial encounter: Secondary | ICD-10-CM | POA: Insufficient documentation

## 2015-12-13 DIAGNOSIS — S82831A Other fracture of upper and lower end of right fibula, initial encounter for closed fracture: Secondary | ICD-10-CM | POA: Insufficient documentation

## 2015-12-13 DIAGNOSIS — I10 Essential (primary) hypertension: Secondary | ICD-10-CM | POA: Insufficient documentation

## 2015-12-13 DIAGNOSIS — Y9389 Activity, other specified: Secondary | ICD-10-CM | POA: Insufficient documentation

## 2015-12-13 DIAGNOSIS — Z79899 Other long term (current) drug therapy: Secondary | ICD-10-CM | POA: Insufficient documentation

## 2015-12-13 DIAGNOSIS — S93401A Sprain of unspecified ligament of right ankle, initial encounter: Secondary | ICD-10-CM

## 2015-12-13 HISTORY — DX: Essential (primary) hypertension: I10

## 2015-12-13 MED ORDER — HYDROCODONE-ACETAMINOPHEN 5-325 MG PO TABS
1.0000 | ORAL_TABLET | Freq: Once | ORAL | Status: AC
Start: 2015-12-13 — End: 2015-12-13
  Administered 2015-12-13: 1 via ORAL
  Filled 2015-12-13: qty 1

## 2015-12-13 MED ORDER — HYDROCODONE-ACETAMINOPHEN 5-325 MG PO TABS: 1.0000 | ORAL_TABLET | Freq: Four times a day (QID) | ORAL | 0 refills | Status: DC | PRN

## 2015-12-13 NOTE — ED Provider Notes (Addendum)
[facility]-EMERGENCY DEPT Provider Note   CSN: [id number] Arrival date & time: 12/13/15  0205     History   Chief Complaint Chief Complaint  Patient presents with  . Ankle Pain    HPI Carmen Garcia is a 50 y.o. female.   Ankle Pain   The incident occurred less than 1 hour ago. The incident occurred at home. The injury mechanism was a fall. The pain is present in the right ankle. The quality of the pain is described as aching and sharp. The pain is mild. The pain has been constant since onset. Associated symptoms include inability to bear weight. Pertinent negatives include no numbness, no loss of motion, no muscle weakness and no loss of sensation. She reports no foreign bodies present. The symptoms are aggravated by bearing weight and activity. She has tried ice for the symptoms.    Past Medical History:  Diagnosis Date  . Hypertension     Patient Active Problem List   Diagnosis Date Noted  . Benign hypertensive heart disease without heart failure 03/15/2013  . Insomnia 12/10/2010  . Screening cholesterol level 12/10/2010  . Papanicolaou test, as part of routine gynecological examination 12/10/2010  . DEPRESSIVE DISORDER, NOS 04/08/2006  . BACK PAIN, LOW 04/08/2006    History reviewed. No pertinent surgical history.  OB History    No data available       Home Medications    Prior to Admission medications   Medication Sig Start Date End Date Taking? Authorizing Provider  cephALEXin (KEFLEX) 500 MG capsule Take 1 capsule (500 mg total) by mouth 4 (four) times daily. 12/23/14   Arthor CaptainAbigail Harris, PA-C  hydrochlorothiazide (HYDRODIURIL) 12.5 MG tablet Take 1 tablet (12.5 mg total) by mouth daily. 03/15/13   Moses MannersWilliam A Hensel, MD  HYDROcodone-acetaminophen (NORCO) 5-325 MG tablet Take 1 tablet by mouth every 6 (six) hours as needed for severe pain. 12/13/15   Marily MemosJason Napoleon Monacelli, MD  nortriptyline (PAMELOR) 50 MG capsule One cap every morning and two caps at bedtime. 03/15/13   Moses MannersWilliam A  Hensel, MD  oxyCODONE-acetaminophen (PERCOCET) 5-325 MG tablet Take 1-2 tablets by mouth every 4 (four) hours as needed. 12/23/14   Arthor CaptainAbigail Harris, PA-C  phenazopyridine (PYRIDIUM) 200 MG tablet Take 1 tablet (200 mg total) by mouth 3 (three) times daily as needed (pain with urination). 12/23/14   Arthor CaptainAbigail Harris, PA-C    Family History History reviewed. No pertinent family history.  Social History Social History  Substance Use Topics  . Smoking status: Current Some Day Smoker    Types: Cigarettes  . Smokeless tobacco: Never Used  . Alcohol use Yes     Comment: occassionally     Allergies   Review of patient's allergies indicates no known allergies.   Review of Systems Review of Systems  Musculoskeletal:       Right ankle pain  Neurological: Negative for numbness.  All other systems reviewed and are negative.    Physical Exam Updated Vital Signs BP (!) 135/113 (BP Location: Left Arm)   Pulse 106   Temp 98.1 F (36.7 C) (Oral)   Resp 22   Ht 5\' 5"  (1.651 m)   Wt 175 lb (79.4 kg)   SpO2 96%   BMI 29.12 kg/m   Physical Exam  Constitutional: She is oriented to person, place, and time. She appears well-developed and well-nourished.  HENT:  Head: Normocephalic and atraumatic.  Eyes: Conjunctivae and EOM are normal.  Neck: Normal range of motion.  Cardiovascular: Normal rate  and regular rhythm.   Pulmonary/Chest: Effort normal. No stridor. No respiratory distress.  Abdominal: Soft. She exhibits no distension. There is no tenderness. There is no guarding.  Musculoskeletal: Normal range of motion. She exhibits tenderness (around right ankle, worse laterally. also proximal fibula). She exhibits no deformity.  Neurological: She is alert and oriented to person, place, and time.  Slurring speech  Skin: Skin is warm and dry.  Nursing note and vitals reviewed.    ED Treatments / Results  Labs (all labs ordered are listed, but only abnormal results are displayed) Labs  Reviewed - No data to display  EKG  EKG Interpretation None       Radiology Dg Ankle Complete Right  Result Date: 12/13/2015 CLINICAL DATA:  Slipped and fell on tile floor, with right lateral ankle pain and swelling. Initial encounter. EXAM: RIGHT ANKLE - COMPLETE 3+ VIEW COMPARISON:  Right ankle radiographs performed 09/28/2010 FINDINGS: A minimally displaced oblique fracture is noted through the distal fibula. The ankle mortise is grossly intact; the interosseous space is within normal limits. No talar tilt or subluxation is seen. The joint spaces are preserved. Lateral soft tissue swelling is noted. IMPRESSION: Minimally displaced oblique fracture through the distal fibula. Electronically Signed   By: Roanna RaiderJeffery  Chang M.D.   On: 12/13/2015 02:56   Dg Knee Complete 4 Views Right  Result Date: 12/13/2015 CLINICAL DATA:  Slipped and fell on tile floor, with right anterior knee pain and swelling. Initial encounter. EXAM: RIGHT KNEE - COMPLETE 4+ VIEW COMPARISON:  None. FINDINGS: There is no evidence of fracture or dislocation. The joint spaces are preserved. No significant degenerative change is seen; the patellofemoral joint is grossly unremarkable in appearance. No significant joint effusion is seen. The visualized soft tissues are normal in appearance. IMPRESSION: No evidence of fracture or dislocation. Electronically Signed   By: Roanna RaiderJeffery  Chang M.D.   On: 12/13/2015 02:57    Procedures Procedures (including critical care time)  Medications Ordered in ED Medications  HYDROcodone-acetaminophen (NORCO/VICODIN) 5-325 MG per tablet 1 tablet (not administered)     Initial Impression / Assessment and Plan / ED Course  I have reviewed the triage vital signs and the nursing notes.  Pertinent labs & imaging results that were available during my care of the patient were reviewed by me and considered in my medical decision making (see chart for details).  Clinical Course    Intoxicated, but  is with her mother. R fibula fracture without evidence of instability. No proximal fracture. Already has crutches. Will also give cam walker, short course of pain meds and orthopedics follow up. Patient counseled on not taking narcotics while drinking alcohol. Tried explaining condition to patient but she continued to curse at me and was not cooperative so instructions printed and discussed with mother. Discharged to care of mother.  Final Clinical Impressions(s) / ED Diagnoses   Final diagnoses:  Sprain of right ankle, unspecified ligament, initial encounter  Closed fracture of distal end of right fibula, unspecified fracture morphology, initial encounter    New Prescriptions New Prescriptions   HYDROCODONE-ACETAMINOPHEN (NORCO) 5-325 MG TABLET    Take 1 tablet by mouth every 6 (six) hours as needed for severe pain.       Marily MemosJason Drue Harr, MD 12/13/15 [phone]

## 2015-12-13 NOTE — ED Triage Notes (Signed)
Pt states that she injured ankle last week and then tonight rolled ankle as she was stepping off step

## 2015-12-13 NOTE — ED Notes (Signed)
Pt wheeled to car via wheelchair. Pt verbalized understanding of discharge instructions.

## 2015-12-13 NOTE — ED Notes (Signed)
Pt admits to drinking earlier this evening

## 2016-03-17 ENCOUNTER — Ambulatory Visit (INDEPENDENT_AMBULATORY_CARE_PROVIDER_SITE_OTHER): Payer: Self-pay | Admitting: Family Medicine

## 2016-03-17 ENCOUNTER — Encounter: Payer: Self-pay | Admitting: Family Medicine

## 2016-03-17 VITALS — BP 142/100 | HR 92 | Temp 98.2°F | Ht 65.0 in | Wt 172.0 lb

## 2016-03-17 DIAGNOSIS — R300 Dysuria: Secondary | ICD-10-CM

## 2016-03-17 DIAGNOSIS — F411 Generalized anxiety disorder: Secondary | ICD-10-CM

## 2016-03-17 DIAGNOSIS — N10 Acute tubulo-interstitial nephritis: Secondary | ICD-10-CM

## 2016-03-17 LAB — POCT URINALYSIS DIPSTICK
Bilirubin, UA: NEGATIVE
Glucose, UA: NEGATIVE
Ketones, UA: NEGATIVE
LEUKOCYTES UA: NEGATIVE
NITRITE UA: NEGATIVE
PH UA: 8
Spec Grav, UA: 1.02
Urobilinogen, UA: 0.2

## 2016-03-17 LAB — POCT UA - MICROSCOPIC ONLY

## 2016-03-17 MED ORDER — PHENAZOPYRIDINE HCL 200 MG PO TABS: 200.0000 mg | ORAL_TABLET | Freq: Three times a day (TID) | ORAL | 0 refills | Status: DC | PRN

## 2016-03-17 MED ORDER — CEPHALEXIN 500 MG PO CAPS: 500.0000 mg | ORAL_CAPSULE | Freq: Four times a day (QID) | ORAL | 0 refills | Status: DC

## 2016-03-17 MED ORDER — HYDROCODONE-ACETAMINOPHEN 5-325 MG PO TABS: 1.0000 | ORAL_TABLET | Freq: Four times a day (QID) | ORAL | 0 refills | Status: DC | PRN

## 2016-03-17 NOTE — Progress Notes (Signed)
Dr. Caroleen Hammanumley requested a Behavioral Health Consult.   Presenting Issue:  Grief  Report of symptoms:  Patient tearfully reported having difficulty with her emotions and daily functioning since the death of her brother 3 and a half years ago. Since his death, she has feelings of guilt related to thinking she could have done something different to help him more. Her brother struggled with alcoholism and patient helped him get into rehab but became frustrated with him and tried "tough love" when he relapsed prior to his death. Patient and her fiance were the ones who found her brother dead in his house, and she frequently has images of the scene flash into her mind. She also continues to have nightmares about finding his body that prevent her from sleeping. She thinks of her brother almost every day and states that she hasn't been able to find any peace, and constantly feels guilty about his death. She has a hard time stopping herself from thinking about him. She does not talk about her feelings with others because she does not want to burden people. She often cries when she is alone at home or in the shower. Patient was quite tearful during our meeting today.   Impact on function:  Patient's symptoms have interfered with her relationships and her work. She feels that she used to have lots of friends and be an outgoing person, and that now she has withdrawn and doesn't socialize. She works at a [employer] and feels that she isn't able to socialize appropriately at work or perform tasks well because any time she thinks about her brother, she breaks down.   Family history of psychiatric issues:  Not assessed  Current and history of substance use:  Not assessed  Assessment / Plan / Recommendations: Carmen Garcia is continuing to struggle with her brother's death. After three years, she feels that her feelings of guilt and sadness have remained stable and she would like help to feel better. She feels that her brother  wouldn't want her to be living like this, but she also can't shake the thought that she could have done something to prevent his death. Finding her brother's body appears to have been quite traumatic for Carmen Garcia and she continues to see this picture in her mind and during her nightmares. She is now open to any help that we can offer, though has concerns for her finances. I spoke with her about individual counseling through hospice as well as support groups and she plans to call this week to set up an appointment. We also practiced deep breathing relaxation, which Carmen Garcia has tried in the past and is open to practicing again. Carmen Garcia has been avoiding seeing a counselor or talking to a doctor about her grief until today, so I would like to follow up with her closely to make sure she seeks out help. She plans to call hospice to set up counseling but will follow up with me in person next week to discuss any barriers to doing this and to check in.

## 2016-03-17 NOTE — Progress Notes (Signed)
Subjective:     Patient ID: Carmen Garcia, female   DOB: 1965-08-19, 51 y.o.   MRN: [id number]  HPI Carmen Garcia is a 51yo female presenting today for UTI. Symptoms started Friday evening. Notes dysuria, abdominal pressure, left flank pain, increased urinary frequency, increased urinary urgency. Noted Nausea yesterday morning, but this has resolved. Denies vomiting. Denies fever, but does not have thermometer. Reports history of previous UTI requiring hospitalization in November 2016. Has been using Azo with some relief.  Became tearful concerning diagnosis of Pyelonephritis. Reports she also has history of finding her brother's dead body 3 years ago. Denies suicidal or homicidal ideation. Has avoided physician visits since her brother's death since she has been unwilling to discuss it at a doctors office. Feels willing to Isabel care and start discussing her brother's death.  Smoker  Review of Systems Per HPI.    Objective:   Physical Exam  Constitutional: She appears well-developed and well-nourished. No distress.  HENT:  Head: Normocephalic and atraumatic.  Cardiovascular: Normal rate and regular rhythm.   No murmur heard. Pulmonary/Chest: Effort normal. No respiratory distress. She has no wheezes.  Abdominal: Soft. She exhibits no distension.  Suprapubic tenderness noted. Left CVA tenderness noted.  Psychiatric:  Tearful.      Assessment and Plan:     1. Acute pyelonephritis Urinalysis with moderate blood, moderate leukocytes, squamous cells. Dirty catch, but given presentation strongly suspect Pyelonephritis. Keflex and Phenazopyridine prescribed. Discussed if no improvement with oral antibiotics or if she continues to worsen, she may require hospitalization for IV antibiotics. Norco prescribed for 3 days for acute pain. Follow up on Friday 2/9. Return precautions discussed. Urine culture pending.  3. Anxiety state Concern for possible Anxiety, Grieving, and/or PTSD. Denies  suicidal ideation. Was already acutely upset following discussion of possible hospitalization, so questioned utility of GAD and PHQ at this visit. Will obtain at follow up visit on 2/9. BH met with patient--please see their note. Recommend following up with PCP.

## 2016-03-17 NOTE — Patient Instructions (Addendum)
Thank you so much for coming to visit today! I suspect you have Pyelonephritis. I have sent a prescription for Keflex to the pharmacy to use 4 times daily for 14 days. I am giving you a prescription for pain medication to use only when needed. I you develop worsening symptoms, fever, or do not notice any improvement with antibiotics, please go to the emergency department. Please return on Friday for follow up.  Dr. Caroleen Hammanumley   Pyelonephritis, Adult Introduction Pyelonephritis is a kidney infection. The kidneys are the organs that filter a person's blood and move waste out of the bloodstream and into the urine. Urine passes from the kidneys, through the ureters, and into the bladder. There are two main types of pyelonephritis:  Infections that come on quickly without any warning (acute pyelonephritis).  Infections that last for a long period of time (chronic pyelonephritis). In most cases, the infection clears up with treatment and does not cause further problems. More severe infections or chronic infections can sometimes spread to the bloodstream or lead to other problems with the kidneys. What are the causes? This condition is usually caused by:  Bacteria traveling from the bladder to the kidney through infected urine. The urine in the bladder can become infected with bacteria from:  Bladder infection (cystitis).  Inflammation of the prostate gland (prostatitis).  Sexual intercourse, in females.  Bacteria traveling from the bloodstream to the kidney. What increases the risk? This condition is more likely to develop in:  Pregnant women.  Older people.  People who have diabetes.  People who have kidney stones or bladder stones.  People who have other abnormalities of the kidney or ureter.  People who have a catheter placed in the bladder.  People who have cancer.  People who are sexually active.  Women who use spermicides.  People who have had a prior urinary tract  infection. What are the signs or symptoms? Symptoms of this condition include:  Frequent urination.  Strong or persistent urge to urinate.  Burning or stinging when urinating.  Abdominal pain.  Back pain.  Pain in the side or flank area.  Fever.  Chills.  Blood in the urine, or dark urine.  Nausea.  Vomiting. How is this diagnosed? This condition may be diagnosed based on:  Medical history and physical exam.  Urine tests.  Blood tests. You may also have imaging tests of the kidneys, such as an ultrasound or CT scan. How is this treated? Treatment for this condition may depend on the severity of the infection.  If the infection is mild and is found early, you may be treated with antibiotic medicines taken by mouth. You will need to drink fluids to remain hydrated.  If the infection is more severe, you may need to stay in the hospital and receive antibiotics given directly into a vein through an IV tube. You may also need to receive fluids through an IV tube if you are not able to remain hydrated. After your hospital stay, you may need to take oral antibiotics for a period of time. Other treatments may be required, depending on the cause of the infection. Follow these instructions at home: Medicines  Take over-the-counter and prescription medicines only as told by your health care provider.  If you were prescribed an antibiotic medicine, take it as told by your health care provider. Do not stop taking the antibiotic even if you start to feel better. General instructions  Drink enough fluid to keep your urine clear or pale  yellow.  Avoid caffeine, tea, and carbonated beverages. They tend to irritate the bladder.  Urinate often. Avoid holding in urine for long periods of time.  Urinate before and after sex.  After a bowel movement, women should cleanse from front to back. Use each tissue only once.  Keep all follow-up visits as told by your health care provider.  This is important. Contact a health care provider if:  Your symptoms do not get better after 2 days of treatment.  Your symptoms get worse.  You have a fever. Get help right away if:  You are unable to take your antibiotics or fluids.  You have shaking chills.  You vomit.  You have severe flank or back pain.  You have extreme weakness or fainting. This information is not intended to replace advice given to you by your health care provider. Make sure you discuss any questions you have with your health care provider. Document Released: 01/26/2005 Document Revised: 07/04/2015 Document Reviewed: 05/21/2014  2017 [employer]

## 2016-03-20 ENCOUNTER — Ambulatory Visit: Payer: Self-pay | Admitting: Family Medicine

## 2016-03-20 ENCOUNTER — Telehealth: Payer: Self-pay | Admitting: Family Medicine

## 2016-03-20 LAB — URINE CULTURE

## 2016-03-20 NOTE — Telephone Encounter (Signed)
Pt canceled appointment for today, pt is self-pay. Pt states she is feeling better and doesn't need to come in today. Pt states feel free to call her with any concerns. ep

## 2016-03-24 ENCOUNTER — Telehealth: Payer: Self-pay

## 2016-03-24 ENCOUNTER — Ambulatory Visit: Payer: Self-pay

## 2016-03-24 NOTE — Telephone Encounter (Signed)
[facility]. Left message for patient asking her to call the office if [facility]'d [facility].

## 2016-05-15 ENCOUNTER — Ambulatory Visit (INDEPENDENT_AMBULATORY_CARE_PROVIDER_SITE_OTHER): Payer: Self-pay | Admitting: Family Medicine

## 2016-05-15 ENCOUNTER — Encounter: Payer: Self-pay | Admitting: Family Medicine

## 2016-05-15 ENCOUNTER — Encounter (HOSPITAL_COMMUNITY): Payer: Self-pay | Admitting: Emergency Medicine

## 2016-05-15 ENCOUNTER — Inpatient Hospital Stay (HOSPITAL_COMMUNITY)
Admission: EM | Admit: 2016-05-15 | Discharge: 2016-05-17 | DRG: 603 | Disposition: A | Discharge status: Home / Self Care | Payer: Self-pay | Attending: Family Medicine | Admitting: Family Medicine

## 2016-05-15 ENCOUNTER — Emergency Department (HOSPITAL_COMMUNITY): Payer: Self-pay

## 2016-05-15 VITALS — BP 150/80 | HR 100 | Temp 98.3°F | Ht 65.0 in | Wt 170.0 lb

## 2016-05-15 DIAGNOSIS — W5501XA Bitten by cat, initial encounter: Secondary | ICD-10-CM

## 2016-05-15 DIAGNOSIS — M79643 Pain in unspecified hand: Secondary | ICD-10-CM

## 2016-05-15 DIAGNOSIS — L03119 Cellulitis of unspecified part of limb: Secondary | ICD-10-CM | POA: Diagnosis present

## 2016-05-15 DIAGNOSIS — M19131 Post-traumatic osteoarthritis, right wrist: Secondary | ICD-10-CM

## 2016-05-15 DIAGNOSIS — Z87891 Personal history of nicotine dependence: Secondary | ICD-10-CM

## 2016-05-15 DIAGNOSIS — L03114 Cellulitis of left upper limb: Principal | ICD-10-CM

## 2016-05-15 DIAGNOSIS — G4700 Insomnia, unspecified: Secondary | ICD-10-CM | POA: Diagnosis present

## 2016-05-15 DIAGNOSIS — K219 Gastro-esophageal reflux disease without esophagitis: Secondary | ICD-10-CM | POA: Diagnosis present

## 2016-05-15 DIAGNOSIS — Z23 Encounter for immunization: Secondary | ICD-10-CM

## 2016-05-15 HISTORY — DX: Cellulitis of left upper limb: L03.114

## 2016-05-15 HISTORY — DX: Depression, unspecified: F32.A

## 2016-05-15 HISTORY — DX: Major depressive disorder, single episode, unspecified: F32.9

## 2016-05-15 HISTORY — DX: Gastro-esophageal reflux disease without esophagitis: K21.9

## 2016-05-15 LAB — BASIC METABOLIC PANEL
ANION GAP: 10 (ref 5–15)
BUN: 10 mg/dL (ref 6–20)
CALCIUM: 9 mg/dL (ref 8.9–10.3)
CO2: 18 mmol/L — ABNORMAL LOW (ref 22–32)
Chloride: 108 mmol/L (ref 101–111)
Creatinine, Ser: 0.99 mg/dL (ref 0.44–1.00)
GFR calc Af Amer: >60 mL/min (ref >60)
GLUCOSE: 92 mg/dL (ref 65–99)
POTASSIUM: 4.2 mmol/L (ref 3.5–5.1)
SODIUM: 136 mmol/L (ref 135–145)

## 2016-05-15 LAB — CBC WITH DIFFERENTIAL/PLATELET
BASOS ABS: 0 10*3/uL (ref 0.0–0.1)
BASOS PCT: 0 %
EOS ABS: 0.1 10*3/uL (ref 0.0–0.7)
EOS PCT: 1 %
HCT: 40.2 % (ref 36.0–46.0)
Hemoglobin: 13.2 g/dL (ref 12.0–15.0)
Lymphocytes Relative: 25 %
Lymphs Abs: 3.1 10*3/uL (ref 0.7–4.0)
MCH: 32.7 pg (ref 26.0–34.0)
MCHC: 32.8 g/dL (ref 30.0–36.0)
MCV: 99.5 fL (ref 78.0–100.0)
Monocytes Absolute: 1 10*3/uL (ref 0.1–1.0)
Monocytes Relative: 8 %
Neutro Abs: 8.2 10*3/uL — ABNORMAL HIGH (ref 1.7–7.7)
Neutrophils Relative %: 66 %
PLATELETS: 245 10*3/uL (ref 150–400)
RBC: 4.04 MIL/uL (ref 3.87–5.11)
RDW: 13 % (ref 11.5–15.5)
WBC: 12.5 10*3/uL — AB (ref 4.0–10.5)

## 2016-05-15 MED ORDER — KETOROLAC TROMETHAMINE 30 MG/ML IJ SOLN
30.0000 mg | Freq: Once | INTRAMUSCULAR | Status: DC
  Administered 2016-05-15: 30 mg via INTRAMUSCULAR

## 2016-05-15 MED ORDER — MORPHINE SULFATE (PF) 4 MG/ML IV SOLN
4.0000 mg | Freq: Once | INTRAVENOUS | Status: AC
  Administered 2016-05-15: 4 mg via INTRAVENOUS
  Filled 2016-05-15: qty 1

## 2016-05-15 MED ORDER — TETANUS-DIPHTH-ACELL PERTUSSIS 5-2.5-18.5 LF-MCG/0.5 IM SUSP
0.5000 mL | Freq: Once | INTRAMUSCULAR | Status: AC
  Administered 2016-05-15: 0.5 mL via INTRAMUSCULAR
  Filled 2016-05-15: qty 0.5

## 2016-05-15 MED ORDER — MORPHINE SULFATE (PF) 10 MG/ML IV SOLN
2.0000 mg | Freq: Once | INTRAVENOUS | Status: DC
  Administered 2016-05-15: 2 mg via INTRAMUSCULAR

## 2016-05-15 MED ORDER — SODIUM CHLORIDE 0.9 % IV SOLN
3.0000 g | Freq: Three times a day (TID) | INTRAVENOUS | Status: DC
  Administered 2016-05-16 – 2016-05-17 (×4): 3 g via INTRAVENOUS
  Filled 2016-05-15 (×5): qty 3

## 2016-05-15 MED ORDER — SODIUM CHLORIDE 0.9 % IV SOLN
3.0000 g | Freq: Once | INTRAVENOUS | Status: AC
  Administered 2016-05-15: 3 g via INTRAVENOUS
  Filled 2016-05-15: qty 3

## 2016-05-15 NOTE — Progress Notes (Signed)
   Subjective: CC: cat bite ZOX:WRUEAV Carmen Garcia is a 51 y.o. female presenting to clinic today for same day appointment. PCP: Sanjuana Letters, MD Concerns today include:  1. Cat bite Patient reports that she sustained a cat bite yesterday around 12:30 pm.  She reports that cat is hers and is an inside cat.  She reports cat is not UTD on vaccinations.  She reports that cat was responding to a stray cat in the yard.  She reports that it became red and swollen later that day.  She has been applying neosporin and washing it with peroxide.  She also put methalate on it.  She reports pain and decreased ROM of wrist.  She reports that it has been weeping.  She denies fevers, nausea.  She does not recall a tetanus shot prior to 2007 except in childhood.  No Known Allergies  Social Hx reviewed. MedHx, current medications and allergies reviewed.  Please see EMR. ROS: Per HPI  Objective: Office vital signs reviewed. BP (!) 150/80   Pulse 100   Temp 98.3 F (36.8 C) (Oral)   Ht  (1.651 m)   Wt 170 lb (77.1 kg)   LMP  (Approximate)   SpO2 98%   BMI 28.29 kg/m   Physical Examination:  General: Awake, alert, well nourished, crying Cardio: tachycardic Extremities: increased warmth of the left hand; moderate swelling with marked limitation in flexion of wrist and fingers.  Unable to form fist.  She has intact sensation in the fingertips.  Some exudate from dorsum of hand wounds. Tenderness out of proportion to exam down to mid forearm.   Neuro: sensation as above      Assessment/ Plan: 51 y.o. female   1. Cat bite, initial encounter.  Her exam was significant for decreased ROM, moderate swelling and erythema concerning for deep seeded infection and possible functional compromise of hand. Discussed concern for deep infection/ hand compromise.  Patient agreed to go to ED for evaluation and possible admission for IV antibiotics.  I suspect she will need imaging of the hand and hand  surgeon consultation.  She certainly will need to have a Tetanus shot + TIG, as she is uncertain about Tetanus vaccination. - ketorolac (TORADOL) 30 MG/ML injection 30 mg; Inject 1 mL (30 mg total) into the muscle once. - Morphine  IM - Patient discharged to ED with her fiance for further evalation.  Raliegh Ip, DO PGY-3, [facility]

## 2016-05-15 NOTE — ED Notes (Signed)
Pt request blood draw from IV.  Nurse change HIV labs til A.M

## 2016-05-15 NOTE — ED Triage Notes (Addendum)
Pt here for cat bite to left hand and scratches as well that happened yesterday; pt has increased pain and swelling to hand; animal is known cat but not up to date on rabies vaccine

## 2016-05-15 NOTE — Consult Note (Signed)
Reason for Consult: Left hand and wrist multiple cat bites Referring Physician: [name] is an 51 y.o. female.  HPI: Status post post-multiple cat bites 24 hours go to the dorsal aspect of her left hand and wrist.  Past Medical History:  Diagnosis Date  . Hypertension     History reviewed. No pertinent surgical history.  History reviewed. No pertinent family history.  Social History:  reports that she quit smoking about 3 years ago. Her smoking use included Cigarettes. She has never used smokeless tobacco. She reports that she drinks alcohol. She reports that she does not use drugs.  Allergies: No Known Allergies  Medications: Scheduled:  Results for orders placed or performed during the hospital encounter of 05/15/16 (from the past 48 hour(s))  CBC with Differential     Status: Abnormal   Collection Time: 05/15/16  7:24 PM  Result Value Ref Range   WBC 12.5 (H) 4.0 - 10.5 K/uL   RBC 4.04 3.87 - 5.11 MIL/uL   Hemoglobin 13.2 12.0 - 15.0 g/dL   HCT 40.2 36.0 - 46.0 %   MCV 99.5 78.0 - 100.0 fL   MCH 32.7 26.0 - 34.0 pg   MCHC 32.8 30.0 - 36.0 g/dL   RDW 13.0 11.5 - 15.5 %   Platelets 245 150 - 400 K/uL   Neutrophils Relative % 66 %   Neutro Abs 8.2 (H) 1.7 - 7.7 K/uL   Lymphocytes Relative 25 %   Lymphs Abs 3.1 0.7 - 4.0 K/uL   Monocytes Relative 8 %   Monocytes Absolute 1.0 0.1 - 1.0 K/uL   Eosinophils Relative 1 %   Eosinophils Absolute 0.1 0.0 - 0.7 K/uL   Basophils Relative 0 %   Basophils Absolute 0.0 0.0 - 0.1 K/uL  Basic metabolic panel     Status: Abnormal   Collection Time: 05/15/16  7:24 PM  Result Value Ref Range   Sodium 136 135 - 145 mmol/L   Potassium 4.2 3.5 - 5.1 mmol/L   Chloride 108 101 - 111 mmol/L   CO2 18 (L) 22 - 32 mmol/L   Glucose, Bld 92 65 - 99 mg/dL   BUN 10 6 - 20 mg/dL   Creatinine, Ser 0.99 0.44 - 1.00 mg/dL   Calcium 9.0 8.9 - 10.3 mg/dL   GFR calc non Af Amer >60 >60 mL/min   GFR calc Af Amer >60 >60 mL/min   Comment: (NOTE) The eGFR has been calculated using the CKD EPI equation. This calculation has not been validated in all clinical situations. eGFR's persistently <60 mL/min signify possible Chronic Kidney Disease.    Anion gap 10 5 - 15    Dg Wrist Complete Left  Result Date: 05/15/2016 CLINICAL DATA:  Cat bite to left hand yesterday with pain and swelling. EXAM: LEFT WRIST - COMPLETE 3+ VIEW COMPARISON:  None. FINDINGS: Examination demonstrates degenerative change over the radiocarpal joint and moderate degenerative change of the scaphoid trapezium joint as well as capital lunate joint. Mild degenerate change of the first carpometacarpal joint. No evidence of acute fracture or dislocation. IMPRESSION: No acute findings. Degenerative changes over the wrist as described. Electronically Signed   By: Marin Olp M.D.   On: 05/15/2016 19:52    Review of Systems  All other systems reviewed and are negative.  Blood pressure (!) 139/96, pulse 74, resp. rate 16, SpO2 98 %. Physical Exam  Constitutional: She is oriented to person, place, and time. She appears well-developed and well-nourished.  HENT:  Head: Normocephalic and atraumatic.  Neck: Normal range of motion.  Cardiovascular: Normal rate.   Respiratory: Effort normal.  Musculoskeletal:       Left wrist: She exhibits tenderness, swelling and laceration.  Left hand and wrist dorsal erythema with multiple cat bite wounds with serous and bloody drainage. No evidence of tendon sheath or joint involvement.  Neurological: She is alert and oriented to person, place, and time.  Skin: Skin is warm. There is erythema.  Psychiatric: She has a normal mood and affect. Her behavior is normal. Judgment and thought content normal.    Assessment/Plan: As above. At this point time I see no need for surgical intervention with current clinical picture and white count of 12,000. Would recommend admission for intravenous Unasyn and warm soapy soaks 4-5  times a day for 20 minutes. Please make nothing by mouth after midnight tonight for reevaluation in the morning to determine whether or not surgical intervention will be necessary.  Carmen Garcia [facility] 05/15/2016, 9:02 PM

## 2016-05-15 NOTE — ED Provider Notes (Signed)
[facility]-EMERGENCY DEPT Provider Note   CSN: [id number] Arrival date & time: 05/15/16  1617     History   Chief Complaint Chief Complaint  Patient presents with  . Animal Bite    HPI Carmen Garcia is a 51 y.o. female.  HPI Carmen Garcia is a 51 y.o. female presents to emergency department with complaint of cat bite and scratches to the left hand. Patient states injury occurred yesterday. She started having swelling and pain to the left wrist and hand last night. This morning swelling got worse and she was able to be seen by her primary care doctor this afternoon. She was sent here for further evaluation of her wound infection. Patient denies any fever or chills. She reports severe pain and left dorsal hand and wrist that is worse with movement and palpation. She reports drainage from several wounds in her wrist. She cleaned her head and apply antiseptic ointment. No other complaints.   Past Medical History:  Diagnosis Date  . Hypertension     Patient Active Problem List   Diagnosis Date Noted  . Benign hypertensive heart disease without heart failure 03/15/2013  . Insomnia 12/10/2010  . Screening cholesterol level 12/10/2010  . Papanicolaou test, as part of routine gynecological examination 12/10/2010  . DEPRESSIVE DISORDER, NOS 04/08/2006  . BACK PAIN, LOW 04/08/2006    History reviewed. No pertinent surgical history.  OB History    No data available       Home Medications    Prior to Admission medications   Medication Sig Start Date End Date Taking? Authorizing Provider  Multiple Vitamin (MULTIVITAMIN) tablet Take 1 tablet by mouth daily.    Historical Provider, MD  phenazopyridine (PYRIDIUM) 200 MG tablet Take 1 tablet (200 mg total) by mouth 3 (three) times daily as needed (pain with urination). 03/17/16    N Rumley, DO  ranitidine (ZANTAC) 75 MG tablet Take 75 mg by mouth once.    Historical Provider, MD  Specialty Vitamins Products (WEIGHT LOSS DAILY MULTI  PO) Take by mouth.    Historical Provider, MD    Family History History reviewed. No pertinent family history.  Social History Social History  Substance Use Topics  . Smoking status: Former Smoker    Types: Cigarettes    Quit date: 05/15/2013  . Smokeless tobacco: Never Used  . Alcohol use Yes     Comment: occassionally     Allergies   Patient has no known allergies.   Review of Systems Review of Systems  Constitutional: Negative for chills and fever.  Musculoskeletal: Positive for arthralgias and joint swelling.  Skin: Positive for color change and wound.  Neurological: Negative for weakness and numbness.  All other systems reviewed and are negative.    Physical Exam Updated Vital Signs BP (!) 157/86   Pulse 81   SpO2 96%   Physical Exam  Constitutional: She appears well-developed and well-nourished. No distress.  Eyes: Conjunctivae are normal.  Neck: Neck supple.  Musculoskeletal:       Hands: Swelling noted to the dorsal left proximal hand and wrist. There are multiple puncture wounds and scratches to the left hand. 2 wound directly over left dorsal wrist appear to have some clear drainage. His diffuse tenderness to palpation over the wrist and dorsal hand. Patient unable to move her left wrist due to pain and swelling.  Neurological: She is alert.  Skin: Skin is warm and dry.  Nursing note and vitals reviewed.  ED Treatments / Results  Labs (all labs ordered are listed, but only abnormal results are displayed) Labs Reviewed  CBC WITH DIFFERENTIAL/PLATELET - Abnormal; Notable for the following:       Result Value   WBC 12.5 (*)    Neutro Abs 8.2 (*)    All other components within normal limits  BASIC METABOLIC PANEL - Abnormal; Notable for the following:    CO2 18 (*)    All other components within normal limits    EKG  EKG Interpretation None       Radiology Dg Wrist Complete Left  Result Date: 05/15/2016 CLINICAL DATA:  Cat bite  to left hand yesterday with pain and swelling. EXAM: LEFT WRIST - COMPLETE 3+ VIEW COMPARISON:  None. FINDINGS: Examination demonstrates degenerative change over the radiocarpal joint and moderate degenerative change of the scaphoid trapezium joint as well as capital lunate joint. Mild degenerate change of the first carpometacarpal joint. No evidence of acute fracture or dislocation. IMPRESSION: No acute findings. Degenerative changes over the wrist as described. Electronically Signed   By: Elberta Fortis M.D.   On: 05/15/2016 19:52    Procedures Procedures (including critical care time)  Medications Ordered in ED Medications  Ampicillin-Sulbactam (UNASYN) 3 g in sodium chloride 0.9 % 100 mL IVPB (not administered)     Initial Impression / Assessment and Plan / ED Course  I have reviewed the triage vital signs and the nursing notes.  Pertinent labs & imaging results that were available during my care of the patient were reviewed by me and considered in my medical decision making (see chart for details).     Patient with developing cellulitis and possibly deep tissue infection in the left dorsal wrist after cat bite yesterday. We'll get basic labs and start IV antibiotics. Will get an x-ray.  9:10 PM Patient's x-ray is unremarkable other than arthritis. Her labs show white blood cell count of 12.5, otherwise unremarkable. She received 3 g of Unasyn IV. I spoke with Dr. Reita Cliche who has seen her in emergency department and recommended antibiotics and soapy water soaks 3 times a day. He also has to make her nothing by mouth past midnight tomorrow in case this infection is getting worse and he would have to take her to the OR. I discussed this with family practice service they will admit patient.  Vitals:   05/15/16 1849 05/15/16 2000 05/15/16 2004  BP: (!) 157/86 (!) 139/96 (!) 139/96  Pulse: 81  74  Resp:   16  SpO2: 96%  98%   10:35 PM Patient initially told me that her cats rabies  vaccine is up to date, but now she states that she did not have any of her vaccines. I explained to the patient that we would need to quarantine her cat and report this to the animal control. Patient became very upset. She does not wish to get vaccine prophylaxis and she does not wish her cat to be [place] obtained. She understands that she may get rabies and die. We will still call animal control.   Final Clinical Impressions(s) / ED Diagnoses   Final diagnoses:  Cat bite, initial encounter  Cellulitis of left upper extremity    New Prescriptions New Prescriptions   No medications on file     Jaynie Crumble, PA-C 05/15/16 2111    Jaynie Crumble, PA-C 05/16/16 0110    Derwood Kaplan, MD 05/24/16 [phone]

## 2016-05-15 NOTE — ED Notes (Signed)
Attempted report at this time.  Nurse to call back when available. 

## 2016-05-15 NOTE — Patient Instructions (Signed)
You received a dose of Toradol  and Morphine  today.  I have recommended that you go to the emergency department for evaluation/ imaging/ antibiotics/ pain medications.

## 2016-05-15 NOTE — Progress Notes (Signed)
Pharmacy Antibiotic Note  Carmen Garcia is a 51 y.o. female admitted on 05/15/2016 with cat bites and scratches.  Pharmacy has been consulted for Unasyn dosing.  Plan: Unasyn 3g every 8 hours Follow renal function, any culture data, and LOT plans Follow up change to PO antibiotics    Temp (24hrs), Avg:98.3 F (36.8 C), Min:98.3 F (36.8 C), Max:98.3 F (36.8 C)   Recent Labs Lab 05/15/16 1924  WBC 12.5*  CREATININE 0.99    Estimated Creatinine Clearance: 69 mL/min (by C-G formula based on SCr of 0.99 mg/dL).    No Known Allergies  Antimicrobials this admission: 4/6 Unasyn >>   Dose adjustments this admission:  Microbiology results:  Thank you for allowing pharmacy to be a part of this patient's care.  Sherron Monday 05/15/2016 9:58 PM

## 2016-05-15 NOTE — ED Notes (Signed)
To go to xray one physician finishes seeing patient.

## 2016-05-15 NOTE — Addendum Note (Signed)
Addended by: Jone Baseman D on: 05/15/2016 04:33 PM   Modules accepted: Orders

## 2016-05-15 NOTE — ED Notes (Signed)
Attempted report again.  Nurse to call back when available. 

## 2016-05-15 NOTE — ED Notes (Signed)
Left hand soaked in warm water with soap.  Tolerated well.

## 2016-05-16 ENCOUNTER — Inpatient Hospital Stay (HOSPITAL_COMMUNITY): Payer: Self-pay

## 2016-05-16 DIAGNOSIS — L03114 Cellulitis of left upper limb: Secondary | ICD-10-CM

## 2016-05-16 DIAGNOSIS — W5501XA Bitten by cat, initial encounter: Secondary | ICD-10-CM

## 2016-05-16 LAB — BASIC METABOLIC PANEL
Anion gap: 12 (ref 5–15)
BUN: 11 mg/dL (ref 6–20)
CALCIUM: 9.2 mg/dL (ref 8.9–10.3)
CO2: 22 mmol/L (ref 22–32)
Chloride: 103 mmol/L (ref 101–111)
Creatinine, Ser: 0.99 mg/dL (ref 0.44–1.00)
GFR calc Af Amer: >60 mL/min (ref >60)
GLUCOSE: 120 mg/dL — AB (ref 65–99)
POTASSIUM: 3.6 mmol/L (ref 3.5–5.1)
SODIUM: 137 mmol/L (ref 135–145)

## 2016-05-16 LAB — CBC
HEMATOCRIT: 40 % (ref 36.0–46.0)
Hemoglobin: 13.1 g/dL (ref 12.0–15.0)
MCH: 32.8 pg (ref 26.0–34.0)
MCHC: 32.8 g/dL (ref 30.0–36.0)
MCV: 100 fL (ref 78.0–100.0)
PLATELETS: 286 10*3/uL (ref 150–400)
RBC: 4 MIL/uL (ref 3.87–5.11)
RDW: 12.9 % (ref 11.5–15.5)
WBC: 10.8 10*3/uL — ABNORMAL HIGH (ref 4.0–10.5)

## 2016-05-16 LAB — APTT: aPTT: 27 seconds (ref 24–36)

## 2016-05-16 LAB — SURGICAL PCR SCREEN
MRSA, PCR: NEGATIVE
Staphylococcus aureus: NEGATIVE

## 2016-05-16 LAB — PROTIME-INR
INR: 0.99
PROTHROMBIN TIME: 13.1 s (ref 11.4–15.2)

## 2016-05-16 LAB — HIV ANTIBODY (ROUTINE TESTING W REFLEX): HIV SCREEN 4TH GENERATION: NONREACTIVE

## 2016-05-16 MED ORDER — KETOROLAC TROMETHAMINE 15 MG/ML IJ SOLN
15.0000 mg | Freq: Four times a day (QID) | INTRAMUSCULAR | Status: DC | PRN
  Administered 2016-05-16 – 2016-05-17 (×5): 15 mg via INTRAVENOUS
  Filled 2016-05-16 (×5): qty 1

## 2016-05-16 MED ORDER — ACETAMINOPHEN 500 MG PO TABS
1000.0000 mg | ORAL_TABLET | Freq: Four times a day (QID) | ORAL | Status: DC | PRN
  Filled 2016-05-16: qty 2

## 2016-05-16 MED ORDER — SODIUM CHLORIDE 0.9 % IV SOLN
INTRAVENOUS | Status: DC
  Administered 2016-05-16: 05:00:00 via INTRAVENOUS

## 2016-05-16 MED ORDER — ENOXAPARIN SODIUM 40 MG/0.4ML ~~LOC~~ SOLN
40.0000 mg | SUBCUTANEOUS | Status: DC
  Administered 2016-05-16: 40 mg via SUBCUTANEOUS
  Filled 2016-05-16: qty 0.4

## 2016-05-16 NOTE — Progress Notes (Signed)
Patient examined at bedside this morning. Clinical exam improved from yesterday evening. Area of erythema is diminished. White count now at 10,000. Would recommend a normal diet today with continuation of warm soapy soaks and IV Augmentin. Please make nothing by mouth after midnight tonight and will reexamine tomorrow morning. Based on today's exam I suspect this patient will be able to be discharged on by mouth antibiotics tomorrow.

## 2016-05-16 NOTE — Progress Notes (Signed)
Family Medicine Teaching Service Daily Progress Note Intern Pager: [phone]  Patient name: Carmen Garcia Medical record number: [id number] Date of birth: 1965/09/05 Age: 51 y.o. Gender: female  Primary Care Provider: Sanjuana Letters, MD Consultants: Hand Surgery Code Status: FULL  Pt Overview and Major Events to Date:  4/6: admit for cellulitis   Assessment and Plan: Carmen Garcia is a 51 y.o. female presenting with left hand/forearm swelling, redness, pain after cat bite . PMH is significant for Depression, Insomnia, HTN.   Cellulitis after Cat Bite: vitals stable, leukocytosis resolved.   - hand surgery: continue  Unasyn, warm soaks 4-5 times a day for 20 minutes.  - Unasyn per pharmacy  - tetanus vaccine given in the ED - toradol  q 6 PRN - Tylenol PRN added for pain   HTN: currently not on medication. Per chart review started on HCTZ in 2015. BP improved this AM - consider starting Norvasc  daily, will hold for now  Right 5th Metatarsal Abnormality/right Wrist abnormality: reports patient fell on her hand a while ago and has had a bump over her distal 5th metatarsal. No pain. 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 She may have a cyst on her right wrist.  - x-ray right hand and wrist  FEN/GI: regular diet, SLIV Prophylaxis: Lovenox  Disposition: pending management of cellulitis  Subjective:  Doing well. Swelling, redness, and pain have improved.  No fever or chills.   Objective: Temp:  [98.3 F (36.8 C)-98.8 F (37.1 C)] 98.5 F (36.9 C) (04/07 0500) Pulse Rate:  [74-100] 83 (04/07 0500) Resp:  [16-18] 18 (04/06 2319) BP: (139-170)/(78-111) 140/78 (04/07 0500) SpO2:  [96 %-99 %] 99 % (04/07 0500) Weight:  [77.1 kg (170 lb)] 77.1 kg (170 lb) (04/06 1521) Physical Exam: GEN: NAD CV: RRR, no murmurs, rubs, or gallops PULM: CTAB, normal effort ABD: Soft, nontender, nondistended, NABS, no organomegaly Left hand, forearm: erythema has improved as  well as the swelling         Laboratory:  Recent Labs Lab 05/15/16 1924 05/16/16 0531  WBC 12.5* 10.8*  HGB 13.2 13.1  HCT 40.2 40.0  PLT 245 286    Recent Labs Lab 05/15/16 1924 05/16/16 0531  NA 136 137  K 4.2 3.6  CL 108 103  CO2 18* 22  BUN 10 11  CREATININE 0.99 0.99  CALCIUM 9.0 9.2  GLUCOSE 92 120*     Imaging/Diagnostic Tests:   Palma Holter, MD 05/16/2016, 9:20 AM PGY-2, [place] [facility] FPTS Intern pager: [phone], text pages welcome

## 2016-05-16 NOTE — H&P (Signed)
Family Medicine Teaching [facility] Admission History and Physical Service Pager: [phone]  Patient name: Carmen Garcia Medical record number: [id number] Date of birth: Jul 21, 1965 Age: 51 y.o. Gender: female  Primary Care Provider: Sanjuana Letters, MD Consultants: Hand Surgery  Code Status: FULL  Chief Complaint: left hand/forearm swelling, redness, pain after cat bite  Assessment and Plan: Carmen Garcia is a 51 y.o. female presenting with left hand/forearm swelling, redness, pain after cat bite . PMH is significant for Depression, Insomnia, HTN.   Cellulitis after Cat Bite: Vitals stable and afebrile. Mild leukocytosis of 12.5.  - admit to med-surg, attending Dr. Randolm Idol - hand surgery consulted in the ED: Unasyn, warm soaks 4-5 times a day for 20 minutes. If symptoms do not improve with this regimen, may need surgery. To re-evaluate in the morning.  - Unasyn per pharmacy  - tetanus vaccine given in the ED - toradol  q 6 PRN  HTN: currently not on medication. Per chart review started on HCTZ in 2015. BP elevated to 140s-150s for SBP. asymptomatic - consider starting Norvasc  daily in the morning.   FEN/GI: NPO for possible surgery in the morning, NS @ 100cc/hr Prophylaxis: SCD due to possible surgery   Disposition: admit to FPTS for management  History of Present Illness:  Carmen Garcia is a 51 y.o. female presenting with left hand and forearm swelling, redness, increased warmth, and pain after being bit by her cat the day before presentation. Reports that after the incident she immediately washed the area with water and hydrogen peroxide and placed antibiotic ointment. This morning the area was swollen, red and one of the sites was draining pus. Denies fevers, chills, nausea, vomiting, abdominal pain. Seen at PCP clinic and was told to go to the ED for further evaluation.   In the ED, she was afebrile with stable vitals. Mild leukocytosis to 12.5. BMP normal.  Hand surgery was called and recommended starting on Unasyn and doing warm soaks 4-5 times a day for 20 minutes. They will re-evaluate to see if surgery is needed.   Review Of Systems: Per HPI with the following additions: no abdominal pain, diarrhea, constipation, dysuria, urinary frequency.   ROS  Patient Active Problem List   Diagnosis Date Noted  . Cellulitis of wrist 05/15/2016  . Benign hypertensive heart disease without heart failure 03/15/2013  . Insomnia 12/10/2010  . Screening cholesterol level 12/10/2010  . Papanicolaou test, as part of routine gynecological examination 12/10/2010  . DEPRESSIVE DISORDER, NOS 04/08/2006  . BACK PAIN, LOW 04/08/2006    Past Medical History: Past Medical History:  Diagnosis Date  . Cellulitis of hand, left 05/15/2016   -multiple cat bites 24 hours go to the dorsal aspect of her left hand and wrist  . Depression    "lost my brother 4 yrs ago" (05/15/2016)  . GERD (gastroesophageal reflux disease)   . Hypertension     Past Surgical History: Past Surgical History:  Procedure Laterality Date  . NO PAST SURGERIES      Social History: Social History  Substance Use Topics  . Smoking status: Former Smoker    Packs/day: 0.50    Years: 30.00    Types: Cigarettes    Quit date: 2014  . Smokeless tobacco: Never Used  . Alcohol use 1.8 oz/week    3 Glasses of wine per week   Additional social history: quit smoking 3 years ago   Family History: History reviewed. No pertinent family history.   Allergies  and Medications: No Known Allergies No current facility-administered medications on file prior to encounter.    Current Outpatient Prescriptions on File Prior to Encounter  Medication Sig Dispense Refill  . Multiple Vitamin (MULTIVITAMIN) tablet Take 1 tablet by mouth daily.    . phenazopyridine (PYRIDIUM) 200 MG tablet Take 1 tablet (200 mg total) by mouth 3 (three) times daily as needed (pain with urination). 15 tablet 0  .  ranitidine (ZANTAC) 75 MG tablet Take 75-150 mg by mouth daily.       Objective: BP (!) 170/96 (BP Location: Right Arm)   Pulse 84   Temp 98.3 F (36.8 C) (Oral)   Resp 18   SpO2 98%  Exam: General: NAD Eyes: PERRL, EMOI ENTM: MMM, oropharynx normal Neck: supple, normal range of motion Cardiovascular: RRR, no murmur, rub, or gallop Respiratory: normal effort, on room air, CTAB Gastrointestinal: soft, nontender, nondistended, + bowel sound MSK: able to move all extremities Derm: Left hand and forearm:able to flex fingers some but cannot grip, able to extend fingers.      Neuro: CN 2-12 grossly intact, no focal deficits, normal speech  Psych: mood and affect appropriate  Labs and Imaging: CBC BMET   Recent Labs Lab 05/15/16 1924  WBC 12.5*  HGB 13.2  HCT 40.2  PLT 245    Recent Labs Lab 05/15/16 1924  NA 136  K 4.2  CL 108  CO2 18*  BUN 10  CREATININE 0.99  GLUCOSE 92  CALCIUM 9.0      Palma Holter, MD 05/16/2016, 4:04 AM PGY-2, [place] [facility] FPTS Intern pager: [phone], text pages welcome

## 2016-05-16 NOTE — Progress Notes (Signed)
ANTICOAGULATION CONSULT NOTE - Initial Consult  Pharmacy Consult for Lovenox Indication: VTE prophylaxis  No Known Allergies  Patient Measurements:   Heparin Dosing Weight: 77  Vital Signs: Temp: 98.5 F (36.9 C) (04/07 0500) Temp Source: Oral (04/07 0500) BP: 140/78 (04/07 0500) Pulse Rate: 83 (04/07 0500)  Labs:  Recent Labs  05/15/16 1924 05/16/16 0531  HGB 13.2 13.1  HCT 40.2 40.0  PLT 245 286  APTT  --  27  LABPROT  --  13.1  INR  --  0.99  CREATININE 0.99 0.99    Estimated Creatinine Clearance: 69 mL/min (by C-G formula based on SCr of 0.99 mg/dL).   Medical History: Past Medical History:  Diagnosis Date  . Cellulitis of hand, left 05/15/2016   -multiple cat bites 24 hours go to the dorsal aspect of her left hand and wrist  . Depression    "lost my brother 4 yrs ago" (05/15/2016)  . GERD (gastroesophageal reflux disease)   . Hypertension     Medications:  Scheduled:  . ampicillin-sulbactam (UNASYN) IV  3 g Intravenous Q8H    Assessment: 51yo female admitted with multiple cat bites and started on IV antibiotics, initially with SCDs due to possible need for surgery and now to change to Lovenox.  CrCl > 30  Goal of Therapy:  Anti-Xa level 0.6-1 units/ml 4hrs after LMWH dose given Monitor platelets by anticoagulation protocol: Yes   Plan:  Lovenox  SQ q12 Watch for s/s of bleeding  Marisue Humble, PharmD Clinical Pharmacist [facility]

## 2016-05-17 DIAGNOSIS — M19131 Post-traumatic osteoarthritis, right wrist: Secondary | ICD-10-CM

## 2016-05-17 DIAGNOSIS — M79642 Pain in left hand: Secondary | ICD-10-CM

## 2016-05-17 LAB — CBC
HCT: 38.4 % (ref 36.0–46.0)
Hemoglobin: 12.4 g/dL (ref 12.0–15.0)
MCH: 32 pg (ref 26.0–34.0)
MCHC: 32.3 g/dL (ref 30.0–36.0)
MCV: 99 fL (ref 78.0–100.0)
PLATELETS: 254 10*3/uL (ref 150–400)
RBC: 3.88 MIL/uL (ref 3.87–5.11)
RDW: 12.8 % (ref 11.5–15.5)
WBC: 7.4 10*3/uL (ref 4.0–10.5)

## 2016-05-17 LAB — BASIC METABOLIC PANEL
Anion gap: 8 (ref 5–15)
BUN: 11 mg/dL (ref 6–20)
CO2: 22 mmol/L (ref 22–32)
CREATININE: 0.86 mg/dL (ref 0.44–1.00)
Calcium: 9 mg/dL (ref 8.9–10.3)
Chloride: 106 mmol/L (ref 101–111)
GFR calc Af Amer: >60 mL/min (ref >60)
GLUCOSE: 113 mg/dL — AB (ref 65–99)
POTASSIUM: 4.1 mmol/L (ref 3.5–5.1)
SODIUM: 136 mmol/L (ref 135–145)

## 2016-05-17 MED ORDER — AMOXICILLIN-POT CLAVULANATE 875-125 MG PO TABS: 1.0000 | ORAL_TABLET | Freq: Two times a day (BID) | ORAL | 0 refills | Status: DC

## 2016-05-17 MED ORDER — AMOXICILLIN-POT CLAVULANATE 875-125 MG PO TABS
1.0000 | ORAL_TABLET | Freq: Two times a day (BID) | ORAL | Status: DC
  Administered 2016-05-17: 1 via ORAL
  Filled 2016-05-17: qty 1

## 2016-05-17 NOTE — Progress Notes (Signed)
Patient examined at bedside this morning. Overall clinical picture much improved. White count now normalized to 7000. Would recommend DC on oral Augmentin 875 mg twice a day. Patient to continue warm soapy soaks 4 times a day. Patient to keep wounds clean and dry with no occlusive dressing. I will need to see the patient in my office this Tuesday for another clinical check.
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
## 2016-05-17 NOTE — Discharge Instructions (Signed)
Cellulitis, Adult Cellulitis is a skin infection. The infected area is usually red and sore. This condition occurs most often in the arms and lower legs. It is very important to get treated for this condition. Follow these instructions at home:  Take over-the-counter and prescription medicines only as told by your doctor.  If you were prescribed an antibiotic medicine, take it as told by your doctor. Do not stop taking the antibiotic even if you start to feel better.  Drink enough fluid to keep your pee (urine) clear or pale yellow.  Do not touch or rub the infected area.  Raise (elevate) the infected area above the level of your heart while you are sitting or lying down.  Place warm or cold wet cloths (warm or cold compresses) on the infected area. Do this as told by your doctor.  Keep all follow-up visits as told by your doctor. This is important. These visits let your doctor make sure your infection is not getting worse. Contact a doctor if:  You have a fever.  Your symptoms do not get better after 1-2 days of treatment.  Your bone or joint under the infected area starts to hurt after the skin has healed.  Your infection comes back. This can happen in the same area or another area.  You have a swollen bump in the infected area.  You have new symptoms.  You feel ill and also have muscle aches and pains. Get help right away if:  Your symptoms get worse.  You feel very sleepy.  You throw up (vomit) or have watery poop (diarrhea) for a long time.  There are red streaks coming from the infected area.  Your red area gets larger.  Your red area turns darker. This information is not intended to replace advice given to you by your health care provider. Make sure you discuss any questions you have with your health care provider. Document Released: 07/15/2007 Document Revised: 07/04/2015 Document Reviewed: 12/05/2014 Elsevier Interactive Patient Education  2017 Elsevier  Inc.  

## 2016-05-17 NOTE — Discharge Summary (Signed)
Family Medicine Teaching [facility] Discharge Summary  Patient name: Carmen Garcia Medical record number: [id number] Date of birth: 1965-09-06 Age: 51 y.o. 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 Moving all digits well w/o pain. Cap refill <2sec. Pain improved per patient. Right hand: obvious shortening and elevation of 5th metacarpal. TTP at this site as well as over the anatomical snuff box. Patient states this is baseline. No ROM limitations or diminished strength. Cap refill <2 sec  Brief Hospital Course:  Patient presented with left hand and forearm swelling/erythema and pain after being bit by her cat a day prior to presentation. She states that immediately after this injury she washed the area with water and hydrogen peroxide. She later applied and antibiotic ointment. The following morning she had noticed significant swelling, pain, and some draining pus from these wounds. She was later seen by her PCP who directed her to the ED for further evaluation. In the ED she was noted to be afebrile with stable vitals. They had noted a mild leukocytosis to 12.5. Patient was started on Unasyn and was seen by orthopedic (hand) surgery.   Patient was observed overnight, was instructed to perform warm soaks 4-5 times a day for 20  minutes. Patient experienced no additional setbacks, or fevers during her stay. On the day of discharge patient's left hand cellulitis had improved drastically. Patient was feeling well and was eager for discharge. Orthopedic surgery was comfortable with discharge on oral antibiotics and had set up follow-up appointment on Tuesday 05/19/16. Patient was discharged with a prescription for Augmentin.  Issues for Follow Up:  1. F/u cellulitis and OP abx tolerance 2. Patient to follow-up on Tueday (05/19/16) with ortho. Patient made aware.  Significant Procedures: none  Significant Labs and Imaging:   Recent Labs Lab 05/15/16 1924 05/16/16 0531 05/17/16 0242  WBC 12.5* 10.8* 7.4  HGB 13.2 13.1 12.4  HCT 40.2 40.0 38.4  PLT 245 286 254    Recent Labs Lab 05/15/16 1924 05/16/16 0531 05/17/16 0242  NA 136 137 136  K 4.2 3.6 4.1  CL 108 103 106  CO2 18* 22 22  GLUCOSE 92 120* 113*  BUN CREATININE 0.99 0.99 0.86  CALCIUM 9.0 9.2 9.0   XR Hand, Right IMPRESSION: 1. Deformity of the distal portion of the right scaphoid bone, compatible with old fracture, with chronic avulsion fracture fragment at its distal margin versus component of fracture nonunion. 2. Chronic appearing fracture of the distal fifth metacarpal bone, with angulation deformity, and with slight ulnar displacement of an associated avulsion fracture fragment. Healing callus formation is seen at the fracture site but there is suboptimal callus formation at the site of the the avulsed fragment.  XR Wrist, Right: IMPRESSION: 1. Deformity of the distal portion of the right scaphoid bone, compatible with old fracture, with chronic avulsion fracture fragment at its distal margin versus component of fracture nonunion. 2. Narrowing of the scaphoid-trapezium/trapezoid joint  space with associated degenerative articular surface sclerosis at the distal pole of the scaphoid. 3. Chronic appearing fracture of the  distal fifth metacarpal bone, with angulation deformity, and with slight ulnar displacement of an associated avulsion fracture fragment. Healing callus formation is seen at the fracture site but there is suboptimal callus formation at the site of the the avulsed fragment.  Results/Tests Pending at Time of Discharge: none  Discharge Medications:  Allergies as of 05/17/2016   No Known Allergies     Medication List    TAKE these medications   amoxicillin-clavulanate 875-125 MG tablet Commonly known as:  AUGMENTIN Take 1 tablet by mouth every 12 (twelve) hours.   multivitamin tablet Take 1 tablet by mouth daily.   NON FORMULARY Take 1 tablet by mouth daily. hydroxycut   phenazopyridine 200 MG tablet Commonly known as:  PYRIDIUM Take 1 tablet (200 mg total) by mouth 3 (three) times daily as needed (pain with urination).   ranitidine 75 MG tablet Commonly known as:  ZANTAC Take 75-150 mg by mouth daily.       Discharge Instructions: Please refer to Patient Instructions section of EMR for full details.  Patient was counseled important signs and symptoms that should prompt return to medical care, changes in medications, dietary instructions, activity restrictions, and follow up appointments.   Follow-Up Appointments: Follow-up Information    Sanjuana Letters, MD. Call in 1 day(s).   Specialty:  Family Medicine Why:  To make a follow up appointment with your primary care provider. Contact information: [address] [place] [address] [phone]        Marlowe Shores, MD. Schedule an appointment as soon as possible for a visit in 2 day(s).   Specialty:  Orthopedic Surgery Why:  Follow-up appointment with Dr. Dustin Flock information: [address] [place] [address] [phone]           Kathee Delton, MD 05/17/2016, 3:07 PM PGY-3, [facility]

## 2016-05-17 NOTE — Progress Notes (Signed)
Pt ready for discharge. Education/instructions reviewed with pt, and all questions/concerns addressed. IV removed and belongings gathered. Pt declined a wheelchair and will be driven home by her spouse. Will continue to monitor

## 2016-05-18 ENCOUNTER — Telehealth: Payer: Self-pay | Admitting: *Deleted

## 2016-05-18 NOTE — Telephone Encounter (Signed)
Received fax from [facility] stating patient can not afford medication or any other option.  Please give them a call at [phone].  Clovis Pu, RN

## 2016-05-20 NOTE — Telephone Encounter (Signed)
My understanding is this was already fixed over the weekend. Is that correct?

## 2016-09-14 ENCOUNTER — Other Ambulatory Visit: Payer: Self-pay | Admitting: Pharmacist

## 2016-09-30 ENCOUNTER — Encounter: Payer: Self-pay | Admitting: Family Medicine

## 2016-09-30 ENCOUNTER — Other Ambulatory Visit (HOSPITAL_COMMUNITY)
Admission: RE | Admit: 2016-09-30 | Discharge: 2016-09-30 | Disposition: A | Discharge status: Home / Self Care | Payer: Self-pay | Source: Ambulatory Visit | Attending: Family Medicine | Admitting: Family Medicine

## 2016-09-30 ENCOUNTER — Ambulatory Visit (INDEPENDENT_AMBULATORY_CARE_PROVIDER_SITE_OTHER): Payer: Self-pay | Admitting: Family Medicine

## 2016-09-30 DIAGNOSIS — F332 Major depressive disorder, recurrent severe without psychotic features: Secondary | ICD-10-CM

## 2016-09-30 DIAGNOSIS — Z01419 Encounter for gynecological examination (general) (routine) without abnormal findings: Secondary | ICD-10-CM | POA: Insufficient documentation

## 2016-09-30 DIAGNOSIS — Z124 Encounter for screening for malignant neoplasm of cervix: Secondary | ICD-10-CM

## 2016-09-30 DIAGNOSIS — Z23 Encounter for immunization: Secondary | ICD-10-CM

## 2016-09-30 DIAGNOSIS — Z1211 Encounter for screening for malignant neoplasm of colon: Secondary | ICD-10-CM

## 2016-09-30 MED ORDER — LORAZEPAM 0.5 MG PO TABS: 0.5000 mg | ORAL_TABLET | Freq: Two times a day (BID) | ORAL | 0 refills | Status: DC | PRN

## 2016-09-30 NOTE — Patient Instructions (Addendum)
See me next week. AVS printed in a hurry.  Verbally agreed to one week FU.  Knows to get mammogram despite my reassuring breast exam.   Contracted for safety.

## 2016-09-30 NOTE — Assessment & Plan Note (Signed)
Gi referral for colonoscopy

## 2016-10-01 LAB — CYTOLOGY - PAP: DIAGNOSIS: NEGATIVE

## 2016-10-01 NOTE — Assessment & Plan Note (Signed)
Pap done.  Normal results.

## 2016-10-01 NOTE — Assessment & Plan Note (Signed)
She rushed out to try to meet her ex at her home.  She did promise to FU in one week.  I also tried to get her with integrated care but she rushed out.  Will establish with them next week.  Began ativan as a stopgap measure.  Will get off benzo and on an antidepressant at the one week visit.

## 2016-10-01 NOTE — Progress Notes (Signed)
   Subjective:    Patient ID: Carmen Garcia, female    DOB: [birth year], 51 y.o.   MRN: [id number]  HPI Oh my, this visit just kept getting more complicated.  It began as a physicial but it turns out the major issue was depression.   1. Depression: acute on chronic.  She began saying she was upset because she had seen a dog hit and killed on the way into our office.  I was reminded that she had two major family upheavals 4 years ago.  First, family lost its land to [place] construction, which caused the family, which had previously all lived in multiple house on the land, to scatter.  Second, she had found her brother after a death ruled accidental but she still considers suspicious.  Only late in the visit did I find out her significant other of 13 year, who had planned to marry in one month, left her.  While in the exam room finishing up, she received a call from him during which he repeatedly said, "It's over."  Contracts for safety.  No SI or HI. 2. Wrist pain bilateral Rt>Left.  Works as a Geophysicist/field seismologist pain.   3. Health maint.  Needs pap and mammo and colonoscopy.  She volunteered that she had been having some left breast/axillary pain.    Review of Systems     Objective:   Physical ExamNeck supple Lungs clear Cardiac RRR without m or g Breast benign.  No mass or axillary tenderness with special attention paid to left upper quadrent. Abd benign. Pelvic, WNL Pap taken.   Large ventral ganglion cyst right hand at wrist, radial sisde. Psych, teary and distraught initially.  Calmed down and asked to do exam (breast and pap).  Then became teary and distraught again when received phone call from ex.        Assessment & Plan:

## 2016-10-07 ENCOUNTER — Telehealth: Payer: Self-pay | Admitting: Family Medicine

## 2016-10-07 ENCOUNTER — Ambulatory Visit: Payer: Self-pay | Admitting: Family Medicine

## 2016-10-07 NOTE — Telephone Encounter (Signed)
Patient called and asked if she could cancel appoitnment due to cost both of the appointment and the gas to get here.  [place]  She is still a mess, but less so than last week.  Boyfriend continues to say he is not coming back.  She has no job and no income.  Contracts for safety.  Her mother is coming this weekend to be with her.  She has a scheduled appointment for a free mammogram.  I gave her my blessing to cancel the appointment. 1. Reemphasized contract for safety. 2. Asked her to see our financial counselor to see if she qualifies for the orange card writeoff. 3. She promises to see me if she further decompensates.

## 2016-10-15 ENCOUNTER — Other Ambulatory Visit: Payer: Self-pay | Admitting: Obstetrics and Gynecology

## 2016-10-15 DIAGNOSIS — N644 Mastodynia: Secondary | ICD-10-CM

## 2016-10-15 DIAGNOSIS — N63 Unspecified lump in unspecified breast: Secondary | ICD-10-CM

## 2016-10-22 ENCOUNTER — Ambulatory Visit (HOSPITAL_COMMUNITY): Payer: Self-pay

## 2016-10-22 ENCOUNTER — Other Ambulatory Visit: Payer: Self-pay

## 2016-11-02 ENCOUNTER — Encounter: Payer: Self-pay | Admitting: Family Medicine

## 2016-11-18 ENCOUNTER — Telehealth (HOSPITAL_COMMUNITY): Payer: Self-pay | Admitting: *Deleted

## 2016-11-18 NOTE — Telephone Encounter (Signed)
Telephoned patient at home number and number is not in service. Trying to reschedule [facility] appointment.

## 2016-11-25 ENCOUNTER — Other Ambulatory Visit: Payer: Self-pay | Admitting: Family Medicine

## 2016-12-02 ENCOUNTER — Other Ambulatory Visit (HOSPITAL_COMMUNITY): Payer: Self-pay | Admitting: *Deleted

## 2016-12-02 DIAGNOSIS — N6320 Unspecified lump in the left breast, unspecified quadrant: Secondary | ICD-10-CM

## 2016-12-03 ENCOUNTER — Other Ambulatory Visit: Payer: Self-pay | Admitting: Family Medicine

## 2016-12-29 ENCOUNTER — Ambulatory Visit (HOSPITAL_COMMUNITY)
Admission: RE | Admit: 2016-12-29 | Discharge: 2016-12-29 | Disposition: A | Discharge status: Home / Self Care | Payer: Self-pay | Source: Ambulatory Visit | Attending: Obstetrics and Gynecology | Admitting: Obstetrics and Gynecology

## 2016-12-29 ENCOUNTER — Encounter (HOSPITAL_COMMUNITY): Payer: Self-pay

## 2016-12-29 ENCOUNTER — Ambulatory Visit
Admission: RE | Admit: 2016-12-29 | Discharge: 2016-12-29 | Disposition: A | Discharge status: Home / Self Care | Payer: No Typology Code available for payment source | Source: Ambulatory Visit | Attending: Obstetrics and Gynecology | Admitting: Obstetrics and Gynecology

## 2016-12-29 ENCOUNTER — Ambulatory Visit: Payer: Self-pay

## 2016-12-29 VITALS — BP 140/90 | Temp 98.4°F | Ht 65.75 in | Wt 162.6 lb

## 2016-12-29 DIAGNOSIS — N644 Mastodynia: Secondary | ICD-10-CM

## 2016-12-29 DIAGNOSIS — Z1239 Encounter for other screening for malignant neoplasm of breast: Secondary | ICD-10-CM

## 2016-12-29 DIAGNOSIS — N6320 Unspecified lump in the left breast, unspecified quadrant: Secondary | ICD-10-CM

## 2016-12-29 NOTE — Patient Instructions (Signed)
Explained breast self awareness with Carmen MondayAngela M Hamrick. Patient did not need a Pap smear today due to last Pap smear was 10/01/2016. Let her know [facility] will cover Pap smears every 3 years unless has a history of abnormal Pap smears. [facility] for [facility]. Appointment scheduled for Tuesday, December 29, 2016 at 1450. Discussed smoking cessation with patient. Referred to the [facility] Quitline and gave resources to free smoking cessation classes at [facility]. Carmen Garcia verbalized understanding.  Chinelo Benn, Kathaleen Maserhristine Poll, RN 1:49 PM

## 2016-12-29 NOTE — Progress Notes (Signed)
Complaints of left breast lump and bilateral breast pain. Patient states the pain comes and goes. Patient rates the pain at a 4 out of 10.  Pap Smear: Pap smear not completed today. [facility] and normal. Per patient has no history of an abnormal Pap smear. Last Pap smear result is in Epic.  Physical exam: Breasts Breasts symmetrical. No skin abnormalities bilateral breasts. No nipple retraction bilateral breasts. No nipple discharge bilateral breasts. No lymphadenopathy. No lumps palpated bilateral breasts. Unable to palpate a lump in patients area of concern. Complaints of bilateral axillary pain on exam. [facility] for diagnostic mamm[facility]. Appointment scheduled for Tuesday, December 29, 2016 at 1450.        Pelvic/Bimanual No Pap smear completed today since last Pap smear was 10/01/2016. Pap smear not indicated per [facility] guidelines.   Smoking History: Patient is a current occasional smoker. Discussed smoking cessation with patient. Referred to the [facility] Quitline and gave resources to free smoking cessation classes at [facility].  Patient Navigation: Patient education provided. Access to services provided for patient through [facility] program.   Colorectal Cancer Screening: Per patient has never had a colonoscopy completed. No complaints today. FIT Test given to patient to complete and return to [facility].

## 2016-12-30 ENCOUNTER — Encounter (HOSPITAL_COMMUNITY): Payer: Self-pay | Admitting: *Deleted

## 2017-01-05 ENCOUNTER — Ambulatory Visit
Admission: RE | Admit: 2017-01-05 | Discharge: 2017-01-05 | Disposition: A | Discharge status: Home / Self Care | Payer: No Typology Code available for payment source | Source: Ambulatory Visit | Attending: Obstetrics and Gynecology | Admitting: Obstetrics and Gynecology

## 2017-12-30 ENCOUNTER — Ambulatory Visit: Payer: Self-pay | Admitting: Family Medicine

## 2018-01-13 ENCOUNTER — Encounter: Payer: Self-pay | Admitting: Family Medicine

## 2018-02-22 ENCOUNTER — Other Ambulatory Visit: Payer: Self-pay | Admitting: Family Medicine

## 2018-02-22 DIAGNOSIS — Z1231 Encounter for screening mammogram for malignant neoplasm of breast: Secondary | ICD-10-CM

## 2018-03-09 ENCOUNTER — Encounter: Payer: Self-pay | Admitting: Family Medicine

## 2018-03-09 ENCOUNTER — Ambulatory Visit (INDEPENDENT_AMBULATORY_CARE_PROVIDER_SITE_OTHER): Payer: Self-pay | Admitting: Family Medicine

## 2018-03-09 ENCOUNTER — Other Ambulatory Visit: Payer: Self-pay

## 2018-03-09 DIAGNOSIS — R635 Abnormal weight gain: Secondary | ICD-10-CM

## 2018-03-09 DIAGNOSIS — F411 Generalized anxiety disorder: Secondary | ICD-10-CM | POA: Insufficient documentation

## 2018-03-09 DIAGNOSIS — Z0000 Encounter for general adult medical examination without abnormal findings: Secondary | ICD-10-CM | POA: Insufficient documentation

## 2018-03-09 DIAGNOSIS — M19131 Post-traumatic osteoarthritis, right wrist: Secondary | ICD-10-CM

## 2018-03-09 MED ORDER — BUSPIRONE HCL 10 MG PO TABS: 10.0000 mg | ORAL_TABLET | Freq: Two times a day (BID) | ORAL | 3 refills | Status: DC

## 2018-03-09 NOTE — Patient Instructions (Addendum)
There are several tests you need done.  I think it would be best if I give you some time to apply for the orange card and then I will order the tests.  That way you are less likely to get stuck with a big bill Get a flu shot at [facility].  It will be cheaper than [place].  Call me so that I can update our records once you get it.  Also call me after you have applied for the [facility] Card so that we can get the tests done.   Eat less and exercise more.  You put on way too much weight. I sent in a new medicine for anxiety and sleep.  I hope it helps. Make an appointment on the way out to apply for the orange card. Say hi to your family for me.

## 2018-03-10 DIAGNOSIS — R635 Abnormal weight gain: Secondary | ICD-10-CM | POA: Insufficient documentation

## 2018-03-10 NOTE — Assessment & Plan Note (Signed)
Try buspar.  She has tried multiple SSRIs in the past and they either failed or she did not stick with them.

## 2018-03-10 NOTE — Assessment & Plan Note (Signed)
Physically healthy. Behind on health maint Anxiety, which is situational is the biggest concern. Will get flu elsewhere due to cost. I want mammo, lipids, thyroid and fit testing but will wait to order to give her a chance to reapply for orange card.

## 2018-03-10 NOTE — Assessment & Plan Note (Signed)
Will need thyroid testing but most likely etiology is lifestyle.

## 2018-03-10 NOTE — Progress Notes (Signed)
Established Patient Office Visit  Subjective:  Patient ID: Carmen Garcia, female    DOB: 1965-10-30  Age: 53 y.o. MRN: [id number]  CC:  Chief Complaint  Patient presents with  . Annual Exam    HPI [name] presents for annual exam. Note: finances are an issue.  She is self pay for the visit and all testing and meds.  She at one point had the orange card.  She let it lapse.  She likely will qualify when she reapplies.  Annual exam.  Issues: 1. Severe anxiety and sleeplessness.  Lifelong.  Worse now. Does not have sig other (left her two years ago and her once close family is scattered.)  Asks for help 2. Rt hand pain.  Old fractures.  Reviewed films.  Old scaffoid fracture with non union.  Old boxers fx with angulation on healing.   3. Rt axillary discomfort.  Specifically worried about breast  No breast lumps, swelling or pain. 4. 30 lb wt gain since last visit.  She recognizes that she is not exercising sufficiently. 5. HPDP, Due for colon cancer screen - would prefer FIT testing over colonoscopy due to cost.  Due for mammogram and cholesterol screen.  No flu shot yet.  Past Medical History:  Diagnosis Date  . Cellulitis of hand, left 05/15/2016   -multiple cat bites 24 hours go to the dorsal aspect of her left hand and wrist  . Depression    "lost my brother 4 yrs ago" (05/15/2016)  . GERD (gastroesophageal reflux disease)   . Hypertension     Past Surgical History:  Procedure Laterality Date  . NO PAST SURGERIES      Family History  Problem Relation Age of Onset  . Cancer Maternal Uncle   . Alzheimer's disease Maternal Grandmother     Social History   Socioeconomic History  . Marital status: Divorced    Spouse name: Not on file  . Number of children: Not on file  . Years of education: Not on file  . Highest education level: Not on file  Occupational History  . Not on file  Social Needs  . Financial resource strain: Not on file  . Food insecurity:   Worry: Not on file    Inability: Not on file  . Transportation needs:    Medical: Not on file    Non-medical: Not on file  Tobacco Use  . Smoking status: Former Smoker    Packs/day: 0.50    Years: 30.00    Pack years: 15.00    Types: Cigarettes    Last attempt to quit: 2014    Years since quitting: 6.0  . Smokeless tobacco: Never Used  Substance and Sexual Activity  . Alcohol use: Yes    Alcohol/week: 3.0 standard drinks    Types: 3 Glasses of wine per week  . Drug use: No  . Sexual activity: Yes  Lifestyle  . Physical activity:    Days per week: 1 day    Minutes per session: 120 min  . Stress: To some extent  Relationships  . Social connections:    Talks on phone: More than three times a week    Gets together: Once a week    Attends religious service: Patient refused    Active member of club or organization: Patient refused    Attends meetings of clubs or organizations: Patient refused    Relationship status: Patient refused  . Intimate partner violence:    Fear of  current or ex partner: Patient refused    Emotionally abused: Patient refused    Physically abused: Patient refused    Forced sexual activity: Patient refused  Other Topics Concern  . Not on file  Social History Narrative  . Not on file    Outpatient Medications Prior to Visit  Medication Sig Dispense Refill  . Multiple Vitamin (MULTIVITAMIN) tablet Take 1 tablet by mouth daily.    Marland Kitchen LORazepam (ATIVAN) 0.5 MG tablet TAKE 1 TABLET BY MOUTH TWICE DAILY AS NEEDED FOR ANXIETY 30 tablet 0  . ranitidine (ZANTAC) 75 MG tablet Take 75-150 mg by mouth daily.      No facility-administered medications prior to visit.     No Known Allergies  ROS Review of Systems   Denies CP, headaches, DOE, abd pain, bleeding per rectum or vagina, focal numbness or weakness Denies change in bowel, bladder, appetite.  Weight gain noted above.  Objective:    Physical Exam  BP 126/84   Pulse 80   Temp 98.1 F (36.7 C)  (Oral)   Ht 5\' 5"  (1.651 m)   Wt 194 lb 9.6 oz (88.3 kg)   SpO2 97%   BMI 32.38 kg/m  Wt Readings from Last 3 Encounters:  03/09/18 194 lb 9.6 oz (88.3 kg)  12/29/16 162 lb 9.6 oz (73.8 kg)  09/30/16 163 lb 12.8 oz (74.3 kg)   HEENT WNL Neck supple Lungs clear Cardiac RRR without m or g Abd benign Breasts normal and both axilla are normal Ext without edema.   Neuro normal motor, sensory and gait.  Affect and mentation normal.  Health Maintenance Due  Topic Date Due  . COLONOSCOPY  08/08/2014  . INFLUENZA VACCINE  09/09/2017    There are no preventive care reminders to display for this patient.  Lab Results  Component Value Date   TSH 2.254 12/28/2008   Lab Results  Component Value Date   WBC 7.4 05/17/2016   HGB 12.4 05/17/2016   HCT 38.4 05/17/2016   MCV 99.0 05/17/2016   PLT 254 05/17/2016   Lab Results  Component Value Date   NA 136 05/17/2016   K 4.1 05/17/2016   CO2 22 05/17/2016   GLUCOSE 113 (H) 05/17/2016   BUN 11 05/17/2016   CREATININE 0.86 05/17/2016   BILITOT 1.1 12/23/2014   ALKPHOS 82 12/23/2014   AST 33 12/23/2014   ALT 51 12/23/2014   PROT 8.2 (H) 12/23/2014   ALBUMIN 4.7 12/23/2014   CALCIUM 9.0 05/17/2016   ANIONGAP 8 05/17/2016   Lab Results  Component Value Date   CHOL 209 (H) 12/10/2010   Lab Results  Component Value Date   HDL 65 12/10/2010   Lab Results  Component Value Date   LDLCALC 109 (H) 12/10/2010   Lab Results  Component Value Date   TRIG 174 (H) 12/10/2010   Lab Results  Component Value Date   CHOLHDL 3.2 12/10/2010   No results found for: HGBA1C    Assessment & Plan:   Problem List Items Addressed This Visit    Generalized anxiety disorder   Relevant Medications   busPIRone (BUSPAR) 10 MG tablet   Encounter for preventive care      Meds ordered this encounter  Medications  . busPIRone (BUSPAR) 10 MG tablet    Sig: Take 1 tablet (10 mg total) by mouth 2 (two) times daily.    Dispense:  60  tablet    Refill:  3    Follow-up: No follow-ups  on file.    Zenia Resides, MD

## 2019-03-08 ENCOUNTER — Telehealth: Payer: Self-pay

## 2019-03-08 NOTE — Telephone Encounter (Signed)
LVM for a return call to our office about the appt for tomorrow. We need to ask you some questions and give you some information in case we do have bad weather tomorrow morning. Sunday Spillers, CMA

## 2019-03-09 ENCOUNTER — Ambulatory Visit: Payer: No Typology Code available for payment source | Admitting: Family Medicine

## 2020-02-19 ENCOUNTER — Ambulatory Visit: Payer: No Typology Code available for payment source | Admitting: Family Medicine

## 2021-07-15 ENCOUNTER — Encounter: Payer: Self-pay | Admitting: *Deleted

## 2021-12-25 ENCOUNTER — Other Ambulatory Visit (HOSPITAL_COMMUNITY)
Admission: RE | Admit: 2021-12-25 | Discharge: 2021-12-25 | Disposition: A | Discharge status: Home / Self Care | Payer: Self-pay | Source: Ambulatory Visit | Attending: Family Medicine | Admitting: Family Medicine

## 2021-12-25 ENCOUNTER — Ambulatory Visit (INDEPENDENT_AMBULATORY_CARE_PROVIDER_SITE_OTHER): Payer: Self-pay | Admitting: Family Medicine

## 2021-12-25 ENCOUNTER — Encounter: Payer: Self-pay | Admitting: Family Medicine

## 2021-12-25 VITALS — BP 146/96 | HR 97 | Wt 173.4 lb

## 2021-12-25 DIAGNOSIS — Z124 Encounter for screening for malignant neoplasm of cervix: Secondary | ICD-10-CM

## 2021-12-25 DIAGNOSIS — R030 Elevated blood-pressure reading, without diagnosis of hypertension: Secondary | ICD-10-CM

## 2021-12-25 DIAGNOSIS — Z1239 Encounter for other screening for malignant neoplasm of breast: Secondary | ICD-10-CM | POA: Insufficient documentation

## 2021-12-25 DIAGNOSIS — I10 Essential (primary) hypertension: Secondary | ICD-10-CM | POA: Insufficient documentation

## 2021-12-25 DIAGNOSIS — Z1231 Encounter for screening mammogram for malignant neoplasm of breast: Secondary | ICD-10-CM

## 2021-12-25 DIAGNOSIS — Z01419 Encounter for gynecological examination (general) (routine) without abnormal findings: Secondary | ICD-10-CM

## 2021-12-25 DIAGNOSIS — Z609 Problem related to social environment, unspecified: Secondary | ICD-10-CM

## 2021-12-25 NOTE — Assessment & Plan Note (Signed)
Ordered routine screening mammo.  Based on my exam, I do not feel she needs a diagnostic mammo.

## 2021-12-25 NOTE — Patient Instructions (Signed)
Make an appointment and go down to the social security office.  You and your husband will likely qualify for Medicaid especially with the new Medicaid expansion.   I am going to do a few things, but try not to run your bill up now.  I would like to see you again before I retire in February.  Hopefully, you will have insurance and we can do blood work and a referral for a colonoscopy.   You need a mammogram now.  I did not feel any lump - don't trust my exam, get a mammogram.   I am not worried about any of the skin things you showed me.   I am worried that you will need blood pressure medicine.  Keep working on weight loss.  Also, a low salt/sodium diet is best for high blood pressure.   We have plenty to do in January.  Please follow through on the insurance so we can take care of your health.
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
## 2021-12-25 NOTE — Progress Notes (Deleted)
    SUBJECTIVE:   CHIEF COMPLAINT / HPI:   Carmen Garcia is a 56 y.o. female who presents today for check-up. Concerns today include lump in R breast, skin lesions, pap.  ***  BP 146/96 today; reports was told she was told she had high BP years ago.  Does not want flu shot today; no interest in COVID vaccine. Hep C screening? Colonoscopy Sh[facility] Mammogram  *** Last visit 02/2018:  1. Severe anxiety and sleeplessness.  Lifelong.  Worse now. Does not have sig other (left her two years ago and her once close family is scattered.)  Asks for help Has tried multiple SSRIs in the past and they either failed or she did not stick with them. Prescribed buspar at 02/2018 OV.  2. Rt hand pain.  Old fractures.  Reviewed films.  Old scaffoid fracture with non union.  Old boxers fx with angulation on healing.    3. Rt axillary discomfort.  Specifically worried about breast  No breast lumps, swelling or pain.  4. 30 lb wt gain since last visit.  She recognizes that she is not exercising sufficiently. Thyroid testing (?)  5. HPDP, Due for colon cancer screen - would prefer FIT testing over colonoscopy due to cost.  Due for mammogram and cholesterol screen.  No flu shot yet  PERTINENT  PMH / PSH: anxiety/depression, GERD, HTN  OBJECTIVE:   BP (!) 146/96   Pulse 97   Wt 173 lb 6.4 oz (78.7 kg)   BMI 28.86 kg/m   ***  ASSESSMENT/PLAN:   No problem-specific Assessment & Plan notes found for this encounter.     Governor Rooks, Medical Student [facility]

## 2021-12-25 NOTE — Assessment & Plan Note (Signed)
[employer]  We agreed to order most important things today and put off other things until January.  This way, we will avoid a big bill today and give her time to sign up for Medicaid - I hope she qualifies.
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
## 2021-12-25 NOTE — Assessment & Plan Note (Signed)
Unclear if true hypertension or anxiety.  Will need labs prior to starting meds.  I recommended low salt diet and exercise.  FU in 6 weeks.

## 2021-12-26 LAB — CYTOLOGY - PAP
Adequacy: ABSENT
Comment: NEGATIVE
Diagnosis: NEGATIVE
High risk HPV: POSITIVE — AB

## 2022-01-08 ENCOUNTER — Other Ambulatory Visit: Payer: Self-pay

## 2022-01-08 DIAGNOSIS — N6310 Unspecified lump in the right breast, unspecified quadrant: Secondary | ICD-10-CM

## 2022-01-15 ENCOUNTER — Ambulatory Visit: Payer: Self-pay | Admitting: Hematology and Oncology

## 2022-01-15 VITALS — BP 152/110 | Wt 172.8 lb

## 2022-01-15 DIAGNOSIS — Z1211 Encounter for screening for malignant neoplasm of colon: Secondary | ICD-10-CM

## 2022-01-15 DIAGNOSIS — N6310 Unspecified lump in the right breast, unspecified quadrant: Secondary | ICD-10-CM

## 2022-01-15 NOTE — Progress Notes (Addendum)
Ms. Carmen Garcia is a 56 y.o. female who presents to [facility] clinic today with complaint of right breast mass.    Pap Smear: Pap not smear completed today. Last Pap smear was 2023 and was abnormal - negative/ +HPV . Per patient has history of an abnormal Pap smear. Last Pap smear result is not available in Epic.   Physical exam: Breasts Breasts symmetrical. No skin abnormalities bilateral breasts. No nipple retraction bilateral breasts. No nipple discharge bilateral breasts. No lymphadenopathy. No lumps palpated bilateral breasts.        Pelvic/Bimanual Pap is not indicated today    Smoking History: Patient has is a former smoker and was not referred to quit line.    Patient Navigation: Patient education provided. Access to services provided for patient through [facility] program. No interpreter provided. No transportation provided   Colorectal Cancer Screening: Per patient has never had colonoscopy completed No complaints today. FIT test to be mailed today.   Breast and Cervical Cancer Risk Assessment: Patient does not have family history of breast cancer, known genetic mutations, or radiation treatment to the chest before age 77. Patient has history of cervical dysplasia, immunocompromised, or DES exposure in-utero.  Risk Assessment   No risk assessment data     A: BCCCP exam without pap smear Complaint of right breast mass. Benign exam. She is very anxious and overwhelmed today regarding social issues. We have given her information regarding the [facility] where she can walk in for assessment.   P: Referred patient to the [facility] for a diagnostic mammogram. Appointment scheduled 01/16/22.  Pascal Lux, NP 01/15/2022 9:58 AM

## 2022-01-15 NOTE — Patient Instructions (Signed)
Taught Carmen Garcia about self breast awareness and gave educational materials to take home. Patient did not need a Pap smear today due to last Pap smear was in 2023 per patient. Let her know BCCCP will cover Pap smears every 5 years unless has a history of abnormal Pap smears. Referred patient to the [facility] for diagnostic mammogram. Appointment scheduled for 01/16/22. Patient aware of appointment and will be there. Let patient know will follow up with her within the next couple weeks with results. Carmen Garcia verbalized understanding. Repeat Pap smear next year.   Pascal Lux, NP 10:47 AM

## 2022-01-15 NOTE — Addendum Note (Signed)
Addended by: Caprice Red on: 01/15/2022 11:04 AM   Modules accepted: Orders

## 2022-01-20 ENCOUNTER — Ambulatory Visit
Admission: RE | Admit: 2022-01-20 | Discharge: 2022-01-20 | Disposition: A | Discharge status: Home / Self Care | Payer: No Typology Code available for payment source | Source: Ambulatory Visit | Attending: Obstetrics and Gynecology | Admitting: Obstetrics and Gynecology

## 2022-01-20 ENCOUNTER — Ambulatory Visit: Payer: No Typology Code available for payment source

## 2022-01-20 DIAGNOSIS — N6310 Unspecified lump in the right breast, unspecified quadrant: Secondary | ICD-10-CM

## 2022-01-27 NOTE — Progress Notes (Signed)
FIT test has been mailed to the pt 01/27/2022. 

## 2022-02-12 ENCOUNTER — Telehealth: Payer: Self-pay

## 2022-02-12 NOTE — Telephone Encounter (Signed)
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 Patient stated Dr. Andria Frames had discussed previously, verbalized understanding.

## 2022-02-12 NOTE — Telephone Encounter (Signed)
Positive HPV with normal Pap and normal Pap in 2018. She needs repeat Pap in one year.   I have left a voicemail for the pt requesting a return call to review results.

## 2022-02-18 LAB — FECAL OCCULT BLOOD, IMMUNOCHEMICAL: Fecal Occult Bld: NEGATIVE

## 2022-02-19 ENCOUNTER — Ambulatory Visit (INDEPENDENT_AMBULATORY_CARE_PROVIDER_SITE_OTHER): Payer: Medicaid Other | Admitting: Family Medicine

## 2022-02-19 ENCOUNTER — Encounter: Payer: Self-pay | Admitting: Family Medicine

## 2022-02-19 VITALS — BP 136/74 | HR 81 | Ht 65.0 in | Wt 173.0 lb

## 2022-02-19 DIAGNOSIS — Z8742 Personal history of other diseases of the female genital tract: Secondary | ICD-10-CM | POA: Diagnosis not present

## 2022-02-19 DIAGNOSIS — Z609 Problem related to social environment, unspecified: Secondary | ICD-10-CM | POA: Diagnosis not present

## 2022-02-19 DIAGNOSIS — R7309 Other abnormal glucose: Secondary | ICD-10-CM

## 2022-02-19 DIAGNOSIS — F411 Generalized anxiety disorder: Secondary | ICD-10-CM | POA: Diagnosis not present

## 2022-02-19 DIAGNOSIS — R030 Elevated blood-pressure reading, without diagnosis of hypertension: Secondary | ICD-10-CM | POA: Diagnosis not present

## 2022-02-19 DIAGNOSIS — G4700 Insomnia, unspecified: Secondary | ICD-10-CM | POA: Diagnosis not present

## 2022-02-19 LAB — POCT GLYCOSYLATED HEMOGLOBIN (HGB A1C): Hemoglobin A1C: 5.4 % (ref 4.0–5.6)

## 2022-02-19 MED ORDER — LORAZEPAM 0.5 MG PO TABS: 0.5000 mg | ORAL_TABLET | Freq: Two times a day (BID) | ORAL | 1 refills | Status: DC | PRN

## 2022-02-19 NOTE — Patient Instructions (Signed)
Please sign up for Medicaid.   Get a pap smear next year.  It has been a pleasure taking care of you and your family. Your new doctor will be Dr. Ky Barban.  Please give her a try.

## 2022-02-20 ENCOUNTER — Encounter: Payer: Self-pay | Admitting: Family Medicine

## 2022-02-20 NOTE — Assessment & Plan Note (Signed)
Low risk for abuse.  Given prn lorazepam  I had prescribed years ago for a death in the family.  She brought bottle and still had some left.

## 2022-02-20 NOTE — Assessment & Plan Note (Signed)
Normal today.  Feel she is anxious.

## 2022-02-20 NOTE — Assessment & Plan Note (Signed)
Repeat pap in one year.

## 2022-02-20 NOTE — Assessment & Plan Note (Signed)
Important for her to apply for Medicaid.

## 2022-02-20 NOTE — Assessment & Plan Note (Signed)
OK to use lorazepam short term to help sleep.

## 2022-02-20 NOTE — Progress Notes (Signed)
    SUBJECTIVE:   CHIEF COMPLAINT / HPI:   Brief FU from visit of 12/25/21.   She was taken aback by high risk HPV result.  She followed up with oncology and found their advice reassuring.  I greatly appreciate that support. Elevated BP.  Reading fine today.  I believe this is more anxiety driven than true hypertension. Anxiety.  Very medication averse.  Just wants something as needed. Elevated BS on random (non fasting) blood work.  A1C today is normal.  Not a problem Still has not applied to Medicaid.  Given information.      OBJECTIVE:   BP 136/74   Pulse 81   Ht 5\' 5"  (1.651 m)   Wt 173 lb (78.5 kg)   LMP 11/24/2014   SpO2 99%   BMI 28.79 kg/m   Lungs clear Cardiac rRR without m or g Affect normal until she became teary saying goodbyes.  ASSESSMENT/PLAN:   No problem-specific Assessment & Plan notes found for this encounter.     Zenia Resides, MD [place]

## 2022-02-25 ENCOUNTER — Telehealth: Payer: Self-pay

## 2022-02-25 NOTE — Telephone Encounter (Signed)
Patient informed negative FIT test results, verbalized understanding.  

## 2022-02-26 ENCOUNTER — Telehealth: Payer: Self-pay

## 2022-02-26 DIAGNOSIS — K029 Dental caries, unspecified: Secondary | ICD-10-CM

## 2022-02-26 NOTE — Telephone Encounter (Signed)
Patient calls nurse line requesting antibiotic for mouth pain.   She states that she has puss pockets on gums around front teeth. She reports pain around areas. She has been gargling with salt water, using mouth wash for canker sores to help alleviate pain.   Denies fever.   She is not established at dental office. Advised that she would likely need an appointment. She states that she cannot afford an additional appointment right now, as she is waiting for her Medicaid to start.   Will forward to PCP for further advisement.   ED/UC precautions discussed.   *Patient is also agreeable to virtual visit if needed.   Talbot Grumbling, RN

## 2022-02-27 DIAGNOSIS — K029 Dental caries, unspecified: Secondary | ICD-10-CM | POA: Insufficient documentation

## 2022-02-27 MED ORDER — PENICILLIN V POTASSIUM 500 MG PO TABS: 500.0000 mg | ORAL_TABLET | Freq: Three times a day (TID) | ORAL | 0 refills | Status: DC

## 2022-02-27 NOTE — Telephone Encounter (Signed)
Known cavities.  Working to afford dental visit.  Antibiotics appropriate.  She knows antibiotics are only a temporizing measure.  Verified pharmacy.

## 2022-03-17 ENCOUNTER — Encounter: Payer: Self-pay | Admitting: Family Medicine

## 2022-05-28 ENCOUNTER — Ambulatory Visit (INDEPENDENT_AMBULATORY_CARE_PROVIDER_SITE_OTHER): Payer: Medicaid Other | Admitting: Family Medicine

## 2022-05-28 ENCOUNTER — Encounter: Payer: Self-pay | Admitting: Family Medicine

## 2022-05-28 VITALS — BP 136/92 | HR 91 | Ht 65.0 in | Wt 173.6 lb

## 2022-05-28 DIAGNOSIS — Z13220 Encounter for screening for lipoid disorders: Secondary | ICD-10-CM | POA: Diagnosis not present

## 2022-05-28 DIAGNOSIS — F411 Generalized anxiety disorder: Secondary | ICD-10-CM | POA: Diagnosis not present

## 2022-05-28 DIAGNOSIS — Z1159 Encounter for screening for other viral diseases: Secondary | ICD-10-CM
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
## 2022-05-28 NOTE — Patient Instructions (Addendum)
It was wonderful to see you today.  Please bring ALL of your medications with you to every visit.   Today we talked about:  Please schedule a virtual visit follow up in 1 week to check in on your anxiety.  We will check your cholesterol and screen for hepatitis C.   Therapy and Counseling Resources Most providers on this list will take Medicaid. Patients with commercial insurance or Medicare should contact their insurance company to get a list of in network providers.  [facility] (takes children) Location 1: [address] [place], [place] [address] Location 2: [address] [place], [place] [address] [phone]   [facility] (spanish speaking therapist available)(habla espanol)(take medicare and medicaid)  [address], [place], [place] [address], [email or web address] [email or web address] [phone]  [facility]:Psychiatry and Counseling [address] [place], [place] [address] [phone]  [facility]   [address], [address] [place], [place] [address]      [phone]  [facility] (spanish available) [address]  [place], [place] [address] [phone]  [facility] (take [facility] and medicare) [address]  [place], [place] [address]       [phone]     [facility] (virtual only) [phone]  Jovita Kussmaul [facility] [address], [place], [place] [phone]  [facility]:  [address] [place] [phone]  [facility]:  [address] Hessie Diener [phone]  [facility] (under & uninsured) [address]   [place] [phone]    [email or web address]    [facility] [address]  [place]    [phone]  [facility] -[address]     Phone:  [phone]     [facility]-  [address]  [phone]    [facility] [address]      [place], [place] [phone]  [facility]: [address] [place], [place], [place]  [phone]   [facility] (Spanish therapist) [email or web address]  [address] [place]  [address]   [place] [address]    [phone]    [facility]   [address],  [place], [place]  [phone]   [facility]  [address] [place]  [phone]  [facility]  [address] [place], [place]        [phone]  Open Access/Walk In Clinic under & uninsured  [facility]  [address] [place], [place] [phone] Crisis [phone]  [facility] [place],  (Spanish)   [address], [place]: [phone]) 8:30 - 12; 1 - 2:30  [facility] [employer],  [address] [place], [place]    [phone]):8:30 - 12; 2 - 3PM  [facility] [employer],  [address]  [place]; [phone]):   Mon - Fri 8 AM - 5 PM  [facility] [address] [place]  MWF 12:30 to 3:00 or call to schedule an appointment  [phone]  Specific Provider options Psychology Today  [email or web address] click on find a therapist  enter your zip code left side and select or tailor a therapist for your specific need.   [facility] Provider Directory [email or web address]  (Medicaid)   Follow all drop down to find a provider  Social Support program [facility] [place] or [employer] [address], [place], [place] Recovery support and educational   24- Hour Availability:   [facility]  [address] [place], [place] [phone] Crisis [phone]  [facility] [phone]  [place] [facility]  [phone]   [facility]  [phone] (after hours)  Therapeutic Alternative/Mobile Crisis   [phone]  [place] National Suicide Hotline  [phone] Len Childs)  Call 911 or go to emergency room  [facility]  [phone]);  [place] and [employer]  [phone]); [place], [place], [place], [place], [place], [place], [place]   Thank you for choosing [facility].   Please call [phone] with any questions about today's appointment.  Please arrive at least 15 minutes prior to your scheduled appointments.   If you had blood work today, I will send you a MyChart message or a letter if results are normal. Otherwise, I will give you a call.   If you had a referral placed, they will call you to set up an appointment. Please give [place] a call if you don't hear back in the next 2 weeks.   If you need additional refills before your next appointment, please call your pharmacy first.   Burley Saver, MD  Family Medicine

## 2022-05-28 NOTE — Assessment & Plan Note (Signed)
Discussed medication and counseling, and risks of long term benzodiazepine use including memory issues and my desire that we find a better long term option Given resources to connect with therapy, virtual visit scheduled for next week to discuss medications further, will review chart for previous medications tried Provided supportive listening, and discussed SSRI as potential first option to try

## 2022-05-29 LAB — LIPID PANEL
Chol/HDL Ratio: 5.5 ratio — ABNORMAL HIGH (ref 0.0–4.4)
Cholesterol, Total: 282 mg/dL — ABNORMAL HIGH (ref 100–199)
HDL: 51 mg/dL (ref >39)
LDL Chol Calc (NIH): 136 mg/dL — ABNORMAL HIGH (ref 0–99)
Triglycerides: 514 mg/dL — ABNORMAL HIGH (ref 0–149)
VLDL Cholesterol Cal: 95 mg/dL — ABNORMAL HIGH (ref 5–40)

## 2022-05-29 LAB — HCV AB W REFLEX TO QUANT PCR: HCV Ab: NONREACTIVE

## 2022-05-29 LAB — HCV INTERPRETATION

## 2022-06-05 ENCOUNTER — Telehealth: Payer: No Typology Code available for payment source | Admitting: Family Medicine

## 2022-06-05 ENCOUNTER — Telehealth: Payer: Self-pay | Admitting: Family Medicine

## 2022-06-05 NOTE — Telephone Encounter (Signed)
Called for virtual visit appt x2, left voicemail to call back.

## 2022-06-25 ENCOUNTER — Other Ambulatory Visit: Payer: Self-pay

## 2022-06-26 ENCOUNTER — Telehealth: Payer: Self-pay | Admitting: Family Medicine

## 2022-06-26 MED ORDER — LORAZEPAM 0.5 MG PO TABS: 0.5000 mg | ORAL_TABLET | Freq: Two times a day (BID) | ORAL | 0 refills | Status: DC | PRN

## 2022-06-26 NOTE — Telephone Encounter (Signed)
Patient returns call to nurse line regarding prescription refill.  ° °Please advise.  ° °Ysabelle Goodroe C Delicia Berens, RN ° °

## 2022-06-26 NOTE — Telephone Encounter (Signed)
Called and verified that I was speaking with Ms Sharp.   Called to discuss Lorazepam medication refill. She notes that she is not taking it every day. At most is taking it every other day to three times a week. Takes two tabs at a time and feels like of all the things she has tried this is what has worked.  I discussed the risks of benzodiazepines long term including sedation and addiction and discussed I would do a one time refill but would need doctors appt to discuss further and would likely switch to different medication that is more preventative long term. Ok with virtual visit as I know she has a long distance to travel.  Answered all questions and concerns. Burley Saver MD

## 2022-10-16 ENCOUNTER — Other Ambulatory Visit: Payer: Self-pay | Admitting: Family Medicine

## 2022-11-20 ENCOUNTER — Emergency Department (HOSPITAL_COMMUNITY): Payer: Medicaid Other

## 2022-11-20 ENCOUNTER — Emergency Department (HOSPITAL_COMMUNITY)
Admission: EM | Admit: 2022-11-20 | Discharge: 2022-11-21 | Disposition: A | Discharge status: Home / Self Care | Payer: Medicaid Other | Attending: Emergency Medicine | Admitting: Emergency Medicine

## 2022-11-20 DIAGNOSIS — M84371A Stress fracture, right ankle, initial encounter for fracture: Secondary | ICD-10-CM | POA: Insufficient documentation

## 2022-11-20 DIAGNOSIS — M4312 Spondylolisthesis, cervical region: Secondary | ICD-10-CM | POA: Diagnosis not present

## 2022-11-20 DIAGNOSIS — M47812 Spondylosis without myelopathy or radiculopathy, cervical region: Secondary | ICD-10-CM | POA: Diagnosis not present

## 2022-11-20 DIAGNOSIS — I10 Essential (primary) hypertension: Secondary | ICD-10-CM | POA: Diagnosis not present

## 2022-11-20 DIAGNOSIS — S9304XA Dislocation of right ankle joint, initial encounter: Secondary | ICD-10-CM

## 2022-11-20 DIAGNOSIS — S199XXA Unspecified injury of neck, initial encounter: Secondary | ICD-10-CM | POA: Diagnosis not present

## 2022-11-20 DIAGNOSIS — R7989 Other specified abnormal findings of blood chemistry: Secondary | ICD-10-CM

## 2022-11-20 DIAGNOSIS — S99911A Unspecified injury of right ankle, initial encounter: Secondary | ICD-10-CM | POA: Diagnosis present

## 2022-11-20 DIAGNOSIS — M4802 Spinal stenosis, cervical region: Secondary | ICD-10-CM | POA: Diagnosis not present

## 2022-11-20 DIAGNOSIS — W010XXA Fall on same level from slipping, tripping and stumbling without subsequent striking against object, initial encounter: Secondary | ICD-10-CM | POA: Diagnosis not present

## 2022-11-20 DIAGNOSIS — S82891A Other fracture of right lower leg, initial encounter for closed fracture: Secondary | ICD-10-CM

## 2022-11-20 DIAGNOSIS — R4182 Altered mental status, unspecified: Secondary | ICD-10-CM | POA: Diagnosis not present

## 2022-11-20 DIAGNOSIS — F10929 Alcohol use, unspecified with intoxication, unspecified: Secondary | ICD-10-CM | POA: Diagnosis not present

## 2022-11-20 DIAGNOSIS — Y92009 Unspecified place in unspecified non-institutional (private) residence as the place of occurrence of the external cause: Secondary | ICD-10-CM | POA: Diagnosis not present

## 2022-11-20 DIAGNOSIS — W19XXXA Unspecified fall, initial encounter: Secondary | ICD-10-CM | POA: Diagnosis not present

## 2022-11-20 MED ORDER — MIDAZOLAM HCL 2 MG/2ML IJ SOLN
INTRAMUSCULAR | Status: AC | PRN
Start: 2022-11-20 — End: 2022-11-20
  Administered 2022-11-20: 2 mg via INTRAVENOUS

## 2022-11-20 MED ORDER — KETAMINE HCL 10 MG/ML IJ SOLN
INTRAMUSCULAR | Status: AC | PRN
Start: 2022-11-20 — End: 2022-11-20
  Administered 2022-11-20: 42 mg via INTRAVENOUS

## 2022-11-20 MED ORDER — FENTANYL CITRATE PF 50 MCG/ML IJ SOSY
PREFILLED_SYRINGE | INTRAMUSCULAR | Status: AC
  Administered 2022-11-20: 50 ug
  Filled 2022-11-20: qty 1

## 2022-11-20 MED ORDER — MIDAZOLAM HCL 2 MG/2ML IJ SOLN
2.0000 mg | Freq: Once | INTRAMUSCULAR | Status: DC
  Filled 2022-11-20: qty 2

## 2022-11-20 MED ORDER — KETAMINE HCL 50 MG/5ML IJ SOSY
0.5000 mg/kg | PREFILLED_SYRINGE | Freq: Once | INTRAMUSCULAR | Status: DC
  Filled 2022-11-20: qty 5

## 2022-11-20 NOTE — ED Provider Notes (Signed)
[place] EMERGENCY DEPARTMENT AT [facility] Provider Note   CSN: [id number] Arrival date & time: 11/20/22  2311     History  Chief Complaint  Patient presents with   Ankle Pain    Obvious Deformity    Carmen Garcia is a 57 y.o. female.  57 year old female brought in by EMS for right ankle injury. +ETOH, patient slipped on water at home, injuring her right ankle. Hx of prior fx. +deformity with tenting of skin medially, no bleeding. No LOC, hx of HTN, not on meds. No other injuries.        Home Medications Prior to Admission medications   Medication Sig Start Date End Date Taking? Authorizing Provider  oxyCODONE (ROXICODONE) 5 MG immediate release tablet Take 1 tablet (5 mg total) by mouth every 4 (four) hours as needed for severe pain. 11/21/22  Yes Jeannie Fend, PA-C  LORazepam (ATIVAN) 0.5 MG tablet TAKE 1 TABLET(0.5 MG) BY MOUTH TWICE DAILY AS NEEDED FOR ANXIETY 10/19/22   Caro Laroche, DO  Multiple Vitamin (MULTIVITAMIN) tablet Take 1 tablet by mouth daily.    [provider]  penicillin v potassium (VEETID) 500 MG tablet Take 1 tablet (500 mg total) by mouth 3 (three) times daily. 02/27/22   Moses Manners, MD      Allergies    Patient has no known allergies.    Review of Systems   Review of Systems Negative except as per HPI Physical Exam Updated Vital Signs BP (!) 141/68   Pulse 80   Temp 97.6 F (36.4 C) (Oral)   Resp 14   Ht 5\' 5"  (1.651 m)   Wt 84.4 kg   LMP 11/24/2014   SpO2 99%   BMI 30.95 kg/m  Physical Exam Vitals and nursing note reviewed.  Constitutional:      General: She is not in acute distress.    Appearance: She is well-developed. She is not diaphoretic.  HENT:     Head: Normocephalic and atraumatic.     Mouth/Throat:     Mouth: Mucous membranes are moist.  Eyes:     Pupils: Pupils are equal, round, and reactive to light.  Cardiovascular:     Pulses: Normal pulses.  Pulmonary:     Effort: Pulmonary  effort is normal.  Abdominal:     Palpations: Abdomen is soft.     Tenderness: There is no abdominal tenderness.  Musculoskeletal:        General: Swelling, tenderness, deformity and signs of injury present.     Cervical back: Normal range of motion and neck supple. No tenderness.     Right ankle: Swelling, deformity and ecchymosis present. Tenderness present. Decreased range of motion.     Comments: Obvious fracture/dislocation with tenting of skin to medial ankle, skin intact. Sensation and motor intact, DP pulse present  Skin:    General: Skin is warm and dry.     Findings: No erythema or rash.  Neurological:     Mental Status: She is alert and oriented to person, place, and time.     Sensory: No sensory deficit.  Psychiatric:        Behavior: Behavior normal.     ED Results / Procedures / Treatments   Labs (all labs ordered are listed, but only abnormal results are displayed) Labs Reviewed  CBC WITH DIFFERENTIAL/PLATELET - Abnormal; Notable for the following components:      Result Value   Hemoglobin 16.5 (*)  HCT 49.0 (*)    All other components within normal limits  COMPREHENSIVE METABOLIC PANEL - Abnormal; Notable for the following components:   CO2 19 (*)    Glucose, Bld 128 (*)    AST 96 (*)    ALT 93 (*)    Anion gap 19 (*)    All other components within normal limits    EKG None  Radiology CT Ankle Right Wo Contrast  Result Date: 11/21/2022 CLINICAL DATA:  Ankle trauma, fracture EXAM: CT OF THE RIGHT ANKLE WITHOUT CONTRAST TECHNIQUE: Multidetector CT imaging of the right ankle was performed according to the standard protocol. Multiplanar CT image reconstructions were also generated. RADIATION DOSE REDUCTION: This exam was performed according to the departmental dose-optimization program which includes automated exposure control, adjustment of the mA and/or kV according to patient size and/or use of iterative reconstruction technique. COMPARISON:  None  Available. FINDINGS: Bones/Joint/Cartilage Mildly displaced oblique fracture through the distal fibula. Displaced transverse fracture at the base of the medial malleolus. Posterior distal tibial fracture. Findings compatible with trimalleolar fracture. Small bone fragments off the lateral aspect of the talus. Widening of the medial and anterior ankle mortise. Ligaments Suboptimally assessed by CT. Muscles and Tendons Unremarkable Soft tissues Soft tissue swelling. IMPRESSION: Displaced trimalleolar fracture.  Disruption of the ankle mortise. Electronically Signed   By: Charlett Nose M.D.   On: 11/21/2022 03:02   CT Cervical Spine Wo Contrast  Result Date: 11/21/2022 CLINICAL DATA:  Neck trauma. EXAM: CT CERVICAL SPINE WITHOUT CONTRAST TECHNIQUE: Multidetector CT imaging of the cervical spine was performed without intravenous contrast. Multiplanar CT image reconstructions were also generated. RADIATION DOSE REDUCTION: This exam was performed according to the departmental dose-optimization program which includes automated exposure control, adjustment of the mA and/or kV according to patient size and/or use of iterative reconstruction technique. COMPARISON:  None Available. FINDINGS: Alignment: There is mild anterolisthesis at C2-C3, C3-C4, and C7-T1. Skull base and vertebrae: No acute fracture. No primary bone lesion or focal pathologic process. Soft tissues and spinal canal: No prevertebral fluid or swelling. No visible canal hematoma. Disc levels: Multilevel intervertebral disc space narrowing, osteophyte formation, and facet arthropathy. Upper chest: No acute abnormality. Other: Carotid artery calcifications are noted. IMPRESSION: Multilevel degenerative changes in the cervical spine without evidence of acute fracture. Electronically Signed   By: Thornell Sartorius M.D.   On: 11/21/2022 02:48   DG Ankle Complete Right  Result Date: 11/21/2022 CLINICAL DATA:  Fall, deformity, post reduction EXAM: RIGHT ANKLE -  COMPLETE 3+ VIEW COMPARISON:  None Available. FINDINGS: Oblique fracture through the distal right fibula and transverse fracture through the base of the medial malleolus. Mild continued displacement. Widening of the medial and anterior ankle mortise. No dislocation. IMPRESSION: Mildly displaced distal fibular and medial malleolar fractures. Widening of the medial and anterior ankle mortise. Electronically Signed   By: Charlett Nose M.D.   On: 11/21/2022 02:32   CT Head Wo Contrast  Result Date: 11/21/2022 CLINICAL DATA:  Status post trauma, altered mental status. EXAM: CT HEAD WITHOUT CONTRAST TECHNIQUE: Contiguous axial images were obtained from the base of the skull through the vertex without intravenous contrast. RADIATION DOSE REDUCTION: This exam was performed according to the departmental dose-optimization program which includes automated exposure control, adjustment of the mA and/or kV according to patient size and/or use of iterative reconstruction technique. COMPARISON:  None Available. FINDINGS: Brain: No evidence of acute infarction, hemorrhage, hydrocephalus, extra-axial collection or mass lesion/mass effect. Vascular: No hyperdense vessel or  unexpected calcification. Skull: Negative for an acute fracture or focal lesion. Sinuses/Orbits: A 6 mm x 8 mm right maxillary sinus polyp versus mucous retention cyst is noted. Other: None. IMPRESSION: 1. No acute intracranial abnormality. 2. Right maxillary sinus polyp versus mucous retention cyst. Electronically Signed   By: Aram Candela M.D.   On: 11/21/2022 00:36    Procedures Reduction of fracture  Date/Time: 11/21/2022 1:47 AM  Performed by: Jeannie Fend, PA-C Authorized by: Jeannie Fend, PA-C  Consent: Verbal consent obtained. Written consent obtained. Risks and benefits: risks, benefits and alternatives were discussed Consent given by: patient Patient understanding: patient states understanding of the procedure being  performed Patient consent: the patient's understanding of the procedure matches consent given Relevant documents: relevant documents present and verified Required items: required blood products, implants, devices, and special equipment available Patient identity confirmed: verbally with patient and arm band Time out: Immediately prior to procedure a "time out" was called to verify the correct patient, procedure, equipment, support staff and site/side marked as required.  Sedation: Patient sedated: yes (sedation managed by Dr. Manus Gunning)  Patient tolerance: patient tolerated the procedure well with no immediate complications   .Splint Application  Date/Time: 11/21/2022 1:48 AM  Performed by: Jeannie Fend, PA-C Authorized by: Jeannie Fend, PA-C   Consent:    Consent obtained:  Verbal and written   Consent given by:  Patient   Risks, benefits, and alternatives were discussed: yes     Risks discussed:  Numbness, pain and swelling   Alternatives discussed:  No treatment Universal protocol:    Patient identity confirmed:  Verbally with patient and arm band Pre-procedure details:    Distal neurologic exam:  Normal   Distal perfusion: distal pulses strong   Procedure details:    Location:  Ankle   Ankle location:  R ankle   Cast type:  Short leg   Splint type:  Short leg and ankle stirrup   Supplies:  Elastic bandage, cotton padding and prefabricated splint Post-procedure details:    Distal neurologic exam:  Normal   Distal perfusion: distal pulses strong     Procedure completion:  Tolerated   Post-procedure imaging: reviewed       Medications Ordered in ED Medications  ketamine 50 mg in normal saline 5 mL (10 mg/mL) syringe (0 mg Intravenous Hold 11/20/22 2349)  midazolam (VERSED) injection 2 mg (0 mg Intravenous Hold 11/20/22 2349)  fentaNYL (SUBLIMAZE) 50 MCG/ML injection (50 mcg  Given 11/20/22 2331)  midazolam (VERSED) injection (2 mg Intravenous Given 11/20/22 2345)   ketamine (KETALAR) injection (42 mg Intravenous Given 11/20/22 2345)    ED Course/ Medical Decision Making/ A&P                                 Medical Decision Making Amount and/or Complexity of Data Reviewed Labs: ordered. Radiology: ordered.  Risk Prescription drug management.  This patient presents to the ED for concern of right ankle injury, this involves an extensive number of treatment options, and is a complaint that carries with it a high risk of complications and morbidity.  The differential diagnosis includes but not limited to fracture, dislocation    Co morbidities that complicate the patient evaluation  HTN, smoker   Additional history obtained:  Additional history obtained from spouse at bedside who contributes to history as above External records from outside source obtained and reviewed including visit to PCP  dated 05/28/22, prior labs on file from 6 years ago. No recent labs in care everywhere    Lab Tests:  I Ordered, and personally interpreted labs.  The pertinent results include:  CBC with elevated hgb and hct. CMP with mildly elevated LFTs.    Imaging Studies ordered:  I ordered imaging studies including CT head, ct c-spine (due to fall with distracting injury and intoxicated), XR right ankle post reduction   I independently visualized and interpreted imaging which showed trimalleolar fracture right ankle. CT head and C-spine without acute traumatic findings  I agree with the radiologist interpretation  Consultations Obtained:  I requested consultation with the ortho- Dr. Claudean Kinds,  and discussed lab and imaging findings as well as pertinent plan - they recommend: has reviewed imaging, agrees with plan to follow up in clinic.  Discussed with ER attending Dr. Manus Gunning who assisted with sedation for reduction and splinting.    Problem List / ED Course / Critical interventions / Medication management  57 year old female brought in by EMS for fall with  right ankle with obvious deformity for fracture with dislocation. Due to acute of condition, attending ER physician was consulted and plan for sedation with reduction prior to obtaining Xrs. Patient tolerated procedure will although did have some spasms of her ankle which made maintaining  alignment difficult. A posterior and stirrup splint was applied by ortho tech with good sensation/motor/cap refill after application noted. Post reduction images with trimalleolar fracture of the right ankle, closed injury. Discussed with Dr. Claudean Kinds with ortho who has reviewed post reduction images and will follow up in clinic. Discussed results with patient, discussed likelihood of surgery for further treatment. Patient verbalizes understanding. Due to alcohol intake tonight and inability to clear c-spine with eoth and distracting injury, ct of the head and c-spine obtained and are reassuring. Discussed elevated LFTs with patient, limit tylenol and etoh, follow up with PCP for recheck.  I ordered medication including fentanyl  for pain  Reevaluation of the patient after these medicines showed that the patient improved I have reviewed the patients home medicines and have made adjustments as needed   Social Determinants of Health:  Lives with spouse at home   Test / Admission - Considered:  Stable for dc. With plan to follow up with ortho.          Final Clinical Impression(s) / ED Diagnoses Final diagnoses:  Fall, initial encounter  Closed right ankle fracture, initial encounter  Closed dislocation of right ankle, initial encounter  Elevated LFTs    Rx / DC Orders ED Discharge Orders          Ordered    oxyCODONE (ROXICODONE) 5 MG immediate release tablet  Every 4 hours PRN        11/21/22 0300              Jeannie Fend, PA-C 11/21/22 6578    Glynn Octave, MD 11/21/22 0725

## 2022-11-20 NOTE — ED Provider Notes (Signed)
.  Sedation  Date/Time: 11/20/2022 11:57 PM  Performed by: Glynn Octave, MD Authorized by: Glynn Octave, MD   Consent:    Consent obtained:  Written and emergent situation   Consent given by:  Patient   Risks discussed:  Allergic reaction, prolonged hypoxia resulting in organ damage, prolonged sedation necessitating reversal, respiratory compromise necessitating ventilatory assistance and intubation, vomiting, nausea, inadequate sedation and dysrhythmia   Alternatives discussed:  Analgesia without sedation Universal protocol:    Procedure explained and questions answered to patient or proxy's satisfaction: yes     Relevant documents present and verified: yes     Immediately prior to procedure, a time out was called: yes     Patient identity confirmed:  Verbally with patient Indications:    Procedure performed:  Dislocation reduction   Procedure necessitating sedation performed by:  Physician performing sedation Pre-sedation assessment:    Time since last food or drink:  6   NPO status caution: unable to specify NPO status     ASA classification: class 1 - normal, healthy patient     Mouth opening:  3 or more finger widths   Mallampati score:  I - soft palate, uvula, fauces, pillars visible   Neck mobility: normal     Pre-sedation assessments completed and reviewed: airway patency, cardiovascular function, hydration status, mental status, nausea/vomiting, pain level, respiratory function and temperature     Pre-sedation assessment completed:  11/20/2022 11:45 PM Immediate pre-procedure details:    Reassessment: Patient reassessed immediately prior to procedure     Reviewed: vital signs, relevant labs/tests and NPO status     Verified: bag valve mask available, emergency equipment available, intubation equipment available, IV patency confirmed, oxygen available, reversal medications available and suction available   Procedure details (see MAR for exact dosages):     Preoxygenation:  Nasal cannula   Sedation:  Ketamine and midazolam   Intended level of sedation: deep   Analgesia:  Fentanyl   Intra-procedure monitoring:  Blood pressure monitoring, cardiac monitor, continuous pulse oximetry, continuous capnometry, frequent LOC assessments and frequent vital sign checks   Intra-procedure events: respiratory depression     Intra-procedure management:  BVM ventilation and supplemental oxygen   Total Provider sedation time (minutes):  15 Post-procedure details:    Post-sedation assessment completed:  11/20/2022 11:58 PM   Attendance: Constant attendance by certified staff until patient recovered     Recovery: Patient returned to pre-procedure baseline     Post-sedation assessments completed and reviewed: airway patency, cardiovascular function, hydration status, mental status, nausea/vomiting, pain level, respiratory function and temperature     Patient is stable for discharge or admission: yes     Procedure completion:  Tolerated well, no immediate complications     Glynn Octave, MD 11/20/22 2358

## 2022-11-20 NOTE — ED Provider Notes (Incomplete)
  [place] EMERGENCY DEPARTMENT AT [facility] Provider Note   CSN: [id number] Arrival date & time: 11/20/22  2311     History {Add pertinent medical, surgical, social history, OB history to HPI:1} Chief Complaint  Patient presents with  . Ankle Pain    Obvious Deformity    Carmen Garcia is a 57 y.o. female.  57 year old female brought in by EMS for right ankle injury. +ETOH, patient slipped on water at home, injuring her right ankle. Hx of prior fx. +deformity with tenting of skin medially, no bleeding. No LOC, hx of HTN, not on meds. No other injuries.        Home Medications Prior to Admission medications   Medication Sig Start Date End Date Taking? Authorizing Provider  LORazepam (ATIVAN) 0.5 MG tablet TAKE 1 TABLET(0.5 MG) BY MOUTH TWICE DAILY AS NEEDED FOR ANXIETY 10/19/22   Caro Laroche, DO  Multiple Vitamin (MULTIVITAMIN) tablet Take 1 tablet by mouth daily.    [provider]  penicillin v potassium (VEETID) 500 MG tablet Take 1 tablet (500 mg total) by mouth 3 (three) times daily. 02/27/22   Moses Manners, MD      Allergies    Patient has no known allergies.    Review of Systems   Review of Systems  Physical Exam Updated Vital Signs LMP 11/24/2014  Physical Exam  ED Results / Procedures / Treatments   Labs (all labs ordered are listed, but only abnormal results are displayed) Labs Reviewed - No data to display  EKG None  Radiology No results found.  Procedures Procedures  {Document cardiac monitor, telemetry assessment procedure when appropriate:1}  Medications Ordered in ED Medications - No data to display  ED Course/ Medical Decision Making/ A&P   {   Click here for ABCD2, HEART and other calculatorsREFRESH Note before signing :1}                              Medical Decision Making Amount and/or Complexity of Data Reviewed Radiology: ordered.   ***  {Document critical care time when  appropriate:1} {Document review of labs and clinical decision tools ie heart score, Chads2Vasc2 etc:1}  {Document your independent review of radiology images, and any outside records:1} {Document your discussion with family members, caretakers, and with consultants:1} {Document social determinants of health affecting pt's care:1} {Document your decision making why or why not admission, treatments were needed:1} Final Clinical Impression(s) / ED Diagnoses Final diagnoses:  None    Rx / DC Orders ED Discharge Orders     None

## 2022-11-20 NOTE — ED Triage Notes (Signed)
Presents via [facility] for RLE injury after slipping on water at home. Endorses etOH (vodka, unknown amount). H/o htn  Denies daily meds.   EMS exam/VS: 218/111, 85, 99%RA  Husband is coming to ER but has also been drinking and so will need to get a ride first

## 2022-11-21 ENCOUNTER — Encounter (HOSPITAL_COMMUNITY): Payer: Self-pay | Admitting: Emergency Medicine

## 2022-11-21 ENCOUNTER — Emergency Department (HOSPITAL_COMMUNITY): Payer: Medicaid Other

## 2022-11-21 ENCOUNTER — Other Ambulatory Visit: Payer: Self-pay

## 2022-11-21 DIAGNOSIS — S199XXA Unspecified injury of neck, initial encounter: Secondary | ICD-10-CM | POA: Diagnosis not present

## 2022-11-21 DIAGNOSIS — R4182 Altered mental status, unspecified: Secondary | ICD-10-CM | POA: Diagnosis not present

## 2022-11-21 DIAGNOSIS — M47812 Spondylosis without myelopathy or radiculopathy, cervical region: Secondary | ICD-10-CM | POA: Diagnosis not present

## 2022-11-21 DIAGNOSIS — M4312 Spondylolisthesis, cervical region: Secondary | ICD-10-CM | POA: Diagnosis not present

## 2022-11-21 DIAGNOSIS — M4802 Spinal stenosis, cervical region: Secondary | ICD-10-CM | POA: Diagnosis not present

## 2022-11-21 LAB — COMPREHENSIVE METABOLIC PANEL
ALT: 93 U/L — ABNORMAL HIGH (ref 0–44)
AST: 96 U/L — ABNORMAL HIGH (ref 15–41)
Albumin: 4.4 g/dL (ref 3.5–5.0)
Alkaline Phosphatase: 83 U/L (ref 38–126)
Anion gap: 19 — ABNORMAL HIGH (ref 5–15)
BUN: 13 mg/dL (ref 6–20)
CO2: 19 mmol/L — ABNORMAL LOW (ref 22–32)
Calcium: 9.8 mg/dL (ref 8.9–10.3)
Chloride: 106 mmol/L (ref 98–111)
Creatinine, Ser: 0.8 mg/dL (ref 0.44–1.00)
GFR, Estimated: >60 mL/min (ref >60)
Glucose, Bld: 128 mg/dL — ABNORMAL HIGH (ref 70–99)
Potassium: 3.5 mmol/L (ref 3.5–5.1)
Sodium: 144 mmol/L (ref 135–145)
Total Bilirubin: 0.8 mg/dL (ref 0.3–1.2)
Total Protein: 7.7 g/dL (ref 6.5–8.1)

## 2022-11-21 LAB — CBC WITH DIFFERENTIAL/PLATELET
Abs Immature Granulocytes: 0.04 10*3/uL (ref 0.00–0.07)
Basophils Absolute: 0.1 10*3/uL (ref 0.0–0.1)
Basophils Relative: 1 %
Eosinophils Absolute: 0.1 10*3/uL (ref 0.0–0.5)
Eosinophils Relative: 2 %
HCT: 49 % — ABNORMAL HIGH (ref 36.0–46.0)
Hemoglobin: 16.5 g/dL — ABNORMAL HIGH (ref 12.0–15.0)
Immature Granulocytes: 1 %
Lymphocytes Relative: 36 %
Lymphs Abs: 3.2 10*3/uL (ref 0.7–4.0)
MCH: 33.3 pg (ref 26.0–34.0)
MCHC: 33.7 g/dL (ref 30.0–36.0)
MCV: 99 fL (ref 80.0–100.0)
Monocytes Absolute: 0.8 10*3/uL (ref 0.1–1.0)
Monocytes Relative: 9 %
Neutro Abs: 4.6 10*3/uL (ref 1.7–7.7)
Neutrophils Relative %: 51 %
Platelets: 211 10*3/uL (ref 150–400)
RBC: 4.95 MIL/uL (ref 3.87–5.11)
RDW: 11.8 % (ref 11.5–15.5)
WBC: 8.8 10*3/uL (ref 4.0–10.5)
nRBC: 0 % (ref 0.0–0.2)

## 2022-11-21 MED ORDER — OXYCODONE HCL 5 MG PO TABS
5.0000 mg | ORAL_TABLET | ORAL | 0 refills | Status: DC | PRN
Start: 2022-11-21 — End: 2023-02-26

## 2022-11-21 NOTE — ED Notes (Signed)
Patient verbalizes understanding of discharge instructions. Opportunity for questioning and answers were provided. Armband removed by staff, pt discharged from ED. Pt taken to ED waiting room via wheelchair. Pt to be transported home with family.

## 2022-11-21 NOTE — Progress Notes (Signed)
RT assisted with conscious sedation w/o complications Rnx2 MD, & Pharm at bedside. RT will monitor as needed.

## 2022-11-21 NOTE — Progress Notes (Signed)
Orthopedic Tech Progress Note Patient Details:  Carmen Garcia [birth year] [id number]  Ortho Devices Type of Ortho Device: Ace wrap, Cotton web roll, Short leg splint, Stirrup splint Ortho Device/Splint Location: RLE Ortho Device/Splint Interventions: Ordered, Application, Adjustment   Post Interventions Patient Tolerated: Fair, Well Instructions Provided: Care of device  [name] 11/21/2022, 12:20 AM

## 2022-11-21 NOTE — Discharge Instructions (Addendum)
Do NOT try and walk on your splint. Keep your splint clean and dry. Use crutches. Elevate the leg as much as possible to help reduce swelling and pain. Apply ice over your splint for 20 minutes at a time.  Take Motrin as needed as directed for pain. Take Oxycodone as needed as prescribed for pain not controlled with Motrin. Oxycodone can cause constipation. Take Miralax as needed.  Follow up with orthopedics, call on Monday to schedule an appointment.   Follow up with your primary care provider to recheck liver labs- your AST and ALT were elevated today. Avoid alcohol and medications that contain Tylenol. If you drink alcohol regularly, discuss gradually decreasing intake with your doctor. Suddenly stopping alcohol consumption can lead to withdrawal.

## 2022-12-02 ENCOUNTER — Ambulatory Visit (HOSPITAL_BASED_OUTPATIENT_CLINIC_OR_DEPARTMENT_OTHER): Payer: Self-pay | Admitting: Orthopaedic Surgery

## 2022-12-02 ENCOUNTER — Ambulatory Visit (HOSPITAL_BASED_OUTPATIENT_CLINIC_OR_DEPARTMENT_OTHER): Payer: Medicaid Other | Admitting: Orthopaedic Surgery

## 2022-12-02 ENCOUNTER — Ambulatory Visit (HOSPITAL_BASED_OUTPATIENT_CLINIC_OR_DEPARTMENT_OTHER)
Admission: RE | Admit: 2022-12-02 | Discharge: 2022-12-02 | Disposition: A | Discharge status: Home / Self Care | Payer: Medicaid Other | Source: Ambulatory Visit | Attending: Orthopaedic Surgery | Admitting: Orthopaedic Surgery

## 2022-12-02 ENCOUNTER — Other Ambulatory Visit (HOSPITAL_BASED_OUTPATIENT_CLINIC_OR_DEPARTMENT_OTHER): Payer: Self-pay

## 2022-12-02 DIAGNOSIS — S82851A Displaced trimalleolar fracture of right lower leg, initial encounter for closed fracture: Secondary | ICD-10-CM

## 2022-12-02 DIAGNOSIS — S42294A Other nondisplaced fracture of upper end of right humerus, initial encounter for closed fracture: Secondary | ICD-10-CM | POA: Diagnosis not present

## 2022-12-02 DIAGNOSIS — M25511 Pain in right shoulder: Secondary | ICD-10-CM | POA: Diagnosis not present

## 2022-12-02 MED ORDER — ASPIRIN 325 MG PO TBEC
325.0000 mg | DELAYED_RELEASE_TABLET | Freq: Every day | ORAL | 0 refills | Status: AC
  Filled 2022-12-02: qty 14, 14d supply, fill #0

## 2022-12-02 MED ORDER — OXYCODONE HCL 5 MG PO TABS
5.0000 mg | ORAL_TABLET | ORAL | 0 refills | Status: DC | PRN
Start: 2022-12-02 — End: 2023-02-26
  Filled 2022-12-02: qty 15, 3d supply, fill #0

## 2022-12-02 MED ORDER — IBUPROFEN 800 MG PO TABS
800.0000 mg | ORAL_TABLET | Freq: Three times a day (TID) | ORAL | 0 refills | Status: AC
  Filled 2022-12-02: qty 30, 10d supply, fill #0

## 2022-12-02 MED ORDER — ACETAMINOPHEN 500 MG PO TABS
500.0000 mg | ORAL_TABLET | Freq: Three times a day (TID) | ORAL | 0 refills | Status: AC
  Filled 2022-12-02: qty 30, 10d supply, fill #0

## 2022-12-02 NOTE — H&P (View-Only) (Signed)
Chief Complaint: Right ankle fracture     History of Present Illness:    Carmen Garcia is a 57 y.o. female presents with a right trimalleolar ankle fracture after a fall when she tripped on a step.  Denies any history of diabetes or loss of sensation of the foot.  She was provisionally reduced and placed in a splint in the emergency room.  She is here today for follow-up.  She has been compliant with nonweightbearing.  Her splint is clean and dry at today's visit.  She does have a history of anxiety but otherwise no significant medical history.  She is here today with her husband.  She works as a Scientist, research (medical).    Surgical History:   None  PMH/PSH/Family History/Social History/Meds/Allergies:    Past Medical History:  Diagnosis Date   Cellulitis of hand, left 05/15/2016   -multiple cat bites 24 hours go to the dorsal aspect of her left hand and wrist   Depression    "lost my brother 4 yrs ago" (05/15/2016)   GERD (gastroesophageal reflux disease)    Hypertension    Past Surgical History:  Procedure Laterality Date   NO PAST SURGERIES     Social History   Socioeconomic History   Marital status: Divorced    Spouse name: Not on file   Number of children: 0   Years of education: Not on file   Highest education level: Not on file  Occupational History   Not on file  Tobacco Use   Smoking status: Former    Current packs/day: 0.00    Average packs/day: 0.5 packs/day for 30.0 years (15.0 ttl pk-yrs)    Types: Cigarettes    Start date: 79    Quit date: 2014    Years since quitting: 10.8   Smokeless tobacco: Never  Vaping Use   Vaping status: Never Used  Substance and Sexual Activity   Alcohol use: Yes    Alcohol/week: 7.0 standard drinks of alcohol    Types: 3 Glasses of wine, 4 Shots of liquor per week    Comment: Daily   Drug use: No   Sexual activity: Yes    Birth control/protection: None  Other Topics Concern   Not on file   Social History Narrative   Pt does not have children and has never been pregnant.   Social Determinants of Health   Financial Resource Strain: Not on file  Food Insecurity: No Food Insecurity (01/15/2022)   Hunger Vital Sign    Worried About Running Out of Food in the Last Year: Never true    Ran Out of Food in the Last Year: Never true  Transportation Needs: No Transportation Needs (01/15/2022)   PRAPARE - Administrator, Civil Service (Medical): No    Lack of Transportation (Non-Medical): No  Physical Activity: Insufficiently Active (12/29/2016)   Exercise Vital Sign    Days of Exercise per Week: 1 day    Minutes of Exercise per Session: 120 min  Stress: Stress Concern Present (12/29/2016)   [employer] of Occupational Health - Occupational Stress Questionnaire    Feeling of Stress : To some extent  Social Connections: Unknown (12/29/2016)   Social Connection and Isolation Panel [NHANES]    Frequency of Communication with Friends and Family: More than three times a  week    Frequency of Social Gatherings with Friends and Family: Once a week    Attends Religious Services: Patient declined    Database administrator or Organizations: Patient declined    Attends Engineer, structural: Patient declined    Marital Status: Patient declined   Family History  Problem Relation Age of Onset   Cancer Maternal Uncle    Alzheimer's disease Maternal Grandmother    No Known Allergies Current Outpatient Medications  Medication Sig Dispense Refill   LORazepam (ATIVAN) 0.5 MG tablet TAKE 1 TABLET(0.5 MG) BY MOUTH TWICE DAILY AS NEEDED FOR ANXIETY 30 tablet 0   Multiple Vitamin (MULTIVITAMIN) tablet Take 1 tablet by mouth daily.     oxyCODONE (ROXICODONE) 5 MG immediate release tablet Take 1 tablet (5 mg total) by mouth every 4 (four) hours as needed for severe pain. 12 tablet 0   penicillin v potassium (VEETID) 500 MG tablet Take 1 tablet (500 mg total) by mouth 3  (three) times daily. 42 tablet 0   No current facility-administered medications for this visit.   No results found.  Review of Systems:   A ROS was performed including pertinent positives and negatives as documented in the HPI.  Physical Exam :   Constitutional: NAD and appears stated age Neurological: Alert and oriented Psych: Appropriate affect and cooperative Last menstrual period 11/24/2014.   Comprehensive Musculoskeletal Exam:    Right ankle with a splint in place that is clean dry and intact.  The exposed toes are warm and well-perfused and fires EHL as well as tibialis anterior and flexors of the toes.  There is some wrinkling about the ankle medially and laterally when going through the splint.  Imaging:   Xray (3 views right ankle, CT scan right ankle): Displaced trimalleolar ankle fracture    I personally reviewed and interpreted the radiographs.   Assessment:   57 y.o. female with a subluxated right trimalleolar ankle fracture.  At today's visit I did discuss that given the nonunion reduced nature of the ankle I would recommend rather urgent surgical reduction and fixation.  I did discuss the risk and benefit of ankle surgery.  We will plan for right ankle fixation after discussion of risks and benefits.  The rehab.  Was also discussed with her.  Plan :    -Plan for right ankle open reduction internal fixation    After a lengthy discussion of treatment options, including risks, benefits, alternatives, complications of surgical and nonsurgical conservative options, the patient elected surgical repair.   The patient  is aware of the material risks  and complications including, but not limited to injury to adjacent structures, neurovascular injury, infection, numbness, bleeding, implant failure, thermal burns, stiffness, persistent pain, failure to heal, disease transmission from allograft, need for further surgery, dislocation, anesthetic risks, blood clots, risks of  death,and others. The probabilities of surgical success and failure discussed with patient given their particular co-morbidities.The time and nature of expected rehabilitation and recovery was discussed.The patient's questions were all answered preoperatively.  No barriers to understanding were noted. I explained the natural history of the disease process and Rx rationale.  I explained to the patient what I considered to be reasonable expectations given their personal situation.  The final treatment plan was arrived at through a shared patient decision making process model.      I personally saw and evaluated the patient, and participated in the management and treatment plan.  Huel Cote, MD Attending Physician, Orthopedic  Surgery  This document was dictated using Dragon voice recognition software. A reasonable attempt at proof reading has been made to minimize errors.

## 2022-12-02 NOTE — Progress Notes (Signed)
Chief Complaint: Right ankle fracture     History of Present Illness:    Carmen Garcia is a 57 y.o. female presents with a right trimalleolar ankle fracture after a fall when she tripped on a step.  Denies any history of diabetes or loss of sensation of the foot.  She was provisionally reduced and placed in a splint in the emergency room.  She is here today for follow-up.  She has been compliant with nonweightbearing.  Her splint is clean and dry at today's visit.  She does have a history of anxiety but otherwise no significant medical history.  She is here today with her husband.  She works as a Scientist, research (medical).    Surgical History:   None  PMH/PSH/Family History/Social History/Meds/Allergies:    Past Medical History:  Diagnosis Date   Cellulitis of hand, left 05/15/2016   -multiple cat bites 24 hours go to the dorsal aspect of her left hand and wrist   Depression    "lost my brother 4 yrs ago" (05/15/2016)   GERD (gastroesophageal reflux disease)    Hypertension    Past Surgical History:  Procedure Laterality Date   NO PAST SURGERIES     Social History   Socioeconomic History   Marital status: Divorced    Spouse name: Not on file   Number of children: 0   Years of education: Not on file   Highest education level: Not on file  Occupational History   Not on file  Tobacco Use   Smoking status: Former    Current packs/day: 0.00    Average packs/day: 0.5 packs/day for 30.0 years (15.0 ttl pk-yrs)    Types: Cigarettes    Start date: 79    Quit date: 2014    Years since quitting: 10.8   Smokeless tobacco: Never  Vaping Use   Vaping status: Never Used  Substance and Sexual Activity   Alcohol use: Yes    Alcohol/week: 7.0 standard drinks of alcohol    Types: 3 Glasses of wine, 4 Shots of liquor per week    Comment: Daily   Drug use: No   Sexual activity: Yes    Birth control/protection: None  Other Topics Concern   Not on file   Social History Narrative   Pt does not have children and has never been pregnant.   Social Determinants of Health   Financial Resource Strain: Not on file  Food Insecurity: No Food Insecurity (01/15/2022)   Hunger Vital Sign    Worried About Running Out of Food in the Last Year: Never true    Ran Out of Food in the Last Year: Never true  Transportation Needs: No Transportation Needs (01/15/2022)   PRAPARE - Administrator, Civil Service (Medical): No    Lack of Transportation (Non-Medical): No  Physical Activity: Insufficiently Active (12/29/2016)   Exercise Vital Sign    Days of Exercise per Week: 1 day    Minutes of Exercise per Session: 120 min  Stress: Stress Concern Present (12/29/2016)   [employer] of Occupational Health - Occupational Stress Questionnaire    Feeling of Stress : To some extent  Social Connections: Unknown (12/29/2016)   Social Connection and Isolation Panel [NHANES]    Frequency of Communication with Friends and Family: More than three times a  week    Frequency of Social Gatherings with Friends and Family: Once a week    Attends Religious Services: Patient declined    Database administrator or Organizations: Patient declined    Attends Engineer, structural: Patient declined    Marital Status: Patient declined   Family History  Problem Relation Age of Onset   Cancer Maternal Uncle    Alzheimer's disease Maternal Grandmother    No Known Allergies Current Outpatient Medications  Medication Sig Dispense Refill   LORazepam (ATIVAN) 0.5 MG tablet TAKE 1 TABLET(0.5 MG) BY MOUTH TWICE DAILY AS NEEDED FOR ANXIETY 30 tablet 0   Multiple Vitamin (MULTIVITAMIN) tablet Take 1 tablet by mouth daily.     oxyCODONE (ROXICODONE) 5 MG immediate release tablet Take 1 tablet (5 mg total) by mouth every 4 (four) hours as needed for severe pain. 12 tablet 0   penicillin v potassium (VEETID) 500 MG tablet Take 1 tablet (500 mg total) by mouth 3  (three) times daily. 42 tablet 0   No current facility-administered medications for this visit.   No results found.  Review of Systems:   A ROS was performed including pertinent positives and negatives as documented in the HPI.  Physical Exam :   Constitutional: NAD and appears stated age Neurological: Alert and oriented Psych: Appropriate affect and cooperative Last menstrual period 11/24/2014.   Comprehensive Musculoskeletal Exam:    Right ankle with a splint in place that is clean dry and intact.  The exposed toes are warm and well-perfused and fires EHL as well as tibialis anterior and flexors of the toes.  There is some wrinkling about the ankle medially and laterally when going through the splint.  Imaging:   Xray (3 views right ankle, CT scan right ankle): Displaced trimalleolar ankle fracture    I personally reviewed and interpreted the radiographs.   Assessment:   57 y.o. female with a subluxated right trimalleolar ankle fracture.  At today's visit I did discuss that given the nonunion reduced nature of the ankle I would recommend rather urgent surgical reduction and fixation.  I did discuss the risk and benefit of ankle surgery.  We will plan for right ankle fixation after discussion of risks and benefits.  The rehab.  Was also discussed with her.  Plan :    -Plan for right ankle open reduction internal fixation    After a lengthy discussion of treatment options, including risks, benefits, alternatives, complications of surgical and nonsurgical conservative options, the patient elected surgical repair.   The patient  is aware of the material risks  and complications including, but not limited to injury to adjacent structures, neurovascular injury, infection, numbness, bleeding, implant failure, thermal burns, stiffness, persistent pain, failure to heal, disease transmission from allograft, need for further surgery, dislocation, anesthetic risks, blood clots, risks of  death,and others. The probabilities of surgical success and failure discussed with patient given their particular co-morbidities.The time and nature of expected rehabilitation and recovery was discussed.The patient's questions were all answered preoperatively.  No barriers to understanding were noted. I explained the natural history of the disease process and Rx rationale.  I explained to the patient what I considered to be reasonable expectations given their personal situation.  The final treatment plan was arrived at through a shared patient decision making process model.      I personally saw and evaluated the patient, and participated in the management and treatment plan.  Huel Cote, MD Attending Physician, Orthopedic  Surgery  This document was dictated using Dragon voice recognition software. A reasonable attempt at proof reading has been made to minimize errors.

## 2022-12-02 NOTE — Addendum Note (Signed)
Addended by: Jeanella Cara on: 12/02/2022 09:49 AM   Modules accepted: Orders

## 2022-12-03 ENCOUNTER — Encounter (HOSPITAL_COMMUNITY): Payer: Self-pay | Admitting: Orthopaedic Surgery

## 2022-12-03 ENCOUNTER — Other Ambulatory Visit: Payer: Self-pay

## 2022-12-03 ENCOUNTER — Other Ambulatory Visit: Payer: Self-pay | Admitting: Family Medicine
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
## 2022-12-03 NOTE — Anesthesia Preprocedure Evaluation (Addendum)
Anesthesia Evaluation  Patient identified by MRN, date of birth, ID band Patient awake    Reviewed: Allergy & Precautions, NPO status , Patient's Chart, lab work & pertinent test results  Airway Mallampati: II  TM Distance: >3 FB Neck ROM: Full    Dental  (+) Dental Advisory Given, Loose,    Pulmonary former smoker   Pulmonary exam normal breath sounds clear to auscultation       Cardiovascular hypertension, Normal cardiovascular exam Rhythm:Regular Rate:Normal     Neuro/Psych  PSYCHIATRIC DISORDERS Anxiety Depression    negative neurological ROS     GI/Hepatic Neg liver ROS,GERD  ,,  Endo/Other  negative endocrine ROS    Renal/GU negative Renal ROS     Musculoskeletal  (+) Arthritis ,    Abdominal  (+) + obese  Peds  Hematology negative hematology ROS (+)   Anesthesia Other Findings   Reproductive/Obstetrics                             Anesthesia Physical Anesthesia Plan  ASA: 2  Anesthesia Plan: General   Post-op Pain Management: Regional block* and Tylenol PO (pre-op)*   Induction: Intravenous  PONV Risk Score and Plan: 3 and Ondansetron, Dexamethasone, Treatment may vary due to age or medical condition and Midazolam  Airway Management Planned: LMA  Additional Equipment:   Intra-op Plan:   Post-operative Plan: Extubation in OR  Informed Consent: I have reviewed the patients History and Physical, chart, labs and discussed the procedure including the risks, benefits and alternatives for the proposed anesthesia with the patient or authorized representative who has indicated his/her understanding and acceptance.     Dental advisory given  Plan Discussed with: CRNA  Anesthesia Plan Comments: (PAT note written 12/03/2022 by Shonna Chock, PA-C.  )       Anesthesia Quick Evaluation

## 2022-12-03 NOTE — Progress Notes (Addendum)
PCP - Caro Laroche, DO Cardiologist - denies  PPM/ICD - denies   Chest x-ray - 02/22/1998 EKG - needs on DOS Stress Test - denies ECHO - denies Cardiac Cath - denies  CPAP - denies  DM- denies  Blood Thinner Instructions: n/a Aspirin Instructions: f/u with surgeon  ERAS Protcol - clears until 0430  COVID TEST- n/a  Anesthesia review: no  Patient verbally denies any shortness of breath, fever, cough and chest pain during phone call   -------------  SDW INSTRUCTIONS given:  Your procedure is scheduled on 10/26.  Report to [facility] Emergency Dept at 0600 A.M., and check in.  Call this number if you have problems the morning of surgery:  [phone]   Remember:  Do not eat after midnight the night before your surgery  You may drink clear liquids until 0430 the morning of your surgery.   Clear liquids allowed are: Water, Non-Citrus Juices (without pulp), Carbonated Beverages, Clear Tea, Black Coffee Only, and Gatorade    Take these medicines the morning of surgery with A SIP OF WATER  Tylenol prn Ativan prn Oxycodone prn  As of today, STOP taking any Aspirin (unless otherwise instructed by your surgeon) Aleve, Naproxen, Ibuprofen, Motrin, Advil, Goody's, BC's, all herbal medications, fish oil, and all vitamins.                      Do not wear jewelry, make up, or nail polish            Do not wear lotions, powders, perfumes/colognes, or deodorant.            Do not shave 48 hours prior to surgery.  Men may shave face and neck.            Do not bring valuables to the hospital.            [facility] is not responsible for any belongings or valuables.  Do NOT Smoke (Tobacco/Vaping) 24 hours prior to your procedure If you use a CPAP at night, you may bring all equipment for your overnight stay.   Contacts, glasses, dentures or bridgework may not be worn into surgery.      For patients admitted to the hospital, discharge time will be determined by your  treatment team.   Patients discharged the day of surgery will not be allowed to drive home, and someone needs to stay with them for 24 hours.    Special instructions:   [facility]- Preparing For Surgery  Before surgery, you can play an important role. Because skin is not sterile, your skin needs to be as free of germs as possible. You can reduce the number of germs on your skin by washing with CHG (chlorahexidine gluconate) Soap before surgery.  CHG is an antiseptic cleaner which kills germs and bonds with the skin to continue killing germs even after washing.    Oral Hygiene is also important to reduce your risk of infection.  Remember - BRUSH YOUR TEETH THE MORNING OF SURGERY WITH YOUR REGULAR TOOTHPASTE  Please do not use if you have an allergy to CHG or antibacterial soaps. If your skin becomes reddened/irritated stop using the CHG.  Do not shave (including legs and underarms) for at least 48 hours prior to first CHG shower. It is OK to shave your face.  Please follow these instructions carefully.   Shower the NIGHT BEFORE SURGERY and the MORNING OF SURGERY with DIAL Soap.   Dennie Bible  yourself dry with a CLEAN TOWEL.  Wear CLEAN PAJAMAS to bed the night before surgery  Place CLEAN SHEETS on your bed the night of your first shower and DO NOT SLEEP WITH PETS.   Day of Surgery: Please shower morning of surgery  Wear Clean/Comfortable clothing the morning of surgery Do not apply any deodorants/lotions.   Remember to brush your teeth WITH YOUR REGULAR TOOTHPASTE.   Questions were answered. Patient verbalized understanding of instructions.

## 2022-12-04 NOTE — Plan of Care (Signed)
 [facility] Tonsillectomy/Adenoidectomy, Postoperative PEDS care plan entered in error.

## 2022-12-05 ENCOUNTER — Encounter (HOSPITAL_COMMUNITY): Payer: Self-pay | Admitting: Orthopaedic Surgery

## 2022-12-05 ENCOUNTER — Other Ambulatory Visit: Payer: Self-pay

## 2022-12-05 ENCOUNTER — Ambulatory Visit (HOSPITAL_COMMUNITY): Payer: Medicaid Other | Admitting: Vascular Surgery

## 2022-12-05 ENCOUNTER — Ambulatory Visit (HOSPITAL_COMMUNITY)
Admission: RE | Admit: 2022-12-05 | Discharge: 2022-12-05 | Disposition: A | Discharge status: Home / Self Care | Payer: Medicaid Other | Attending: Orthopaedic Surgery | Admitting: Orthopaedic Surgery

## 2022-12-05 ENCOUNTER — Ambulatory Visit (HOSPITAL_COMMUNITY): Payer: Medicaid Other

## 2022-12-05 ENCOUNTER — Encounter (HOSPITAL_COMMUNITY)
Admission: RE | Disposition: A | Discharge status: Home / Self Care | Payer: Self-pay | Source: Home / Self Care | Attending: Orthopaedic Surgery

## 2022-12-05 DIAGNOSIS — I10 Essential (primary) hypertension: Secondary | ICD-10-CM | POA: Diagnosis not present

## 2022-12-05 DIAGNOSIS — Z87891 Personal history of nicotine dependence: Secondary | ICD-10-CM | POA: Diagnosis not present

## 2022-12-05 DIAGNOSIS — F32A Depression, unspecified: Secondary | ICD-10-CM | POA: Diagnosis not present

## 2022-12-05 DIAGNOSIS — S8251XA Displaced fracture of medial malleolus of right tibia, initial encounter for closed fracture: Secondary | ICD-10-CM | POA: Diagnosis not present

## 2022-12-05 DIAGNOSIS — W010XXA Fall on same level from slipping, tripping and stumbling without subsequent striking against object, initial encounter: Secondary | ICD-10-CM | POA: Insufficient documentation

## 2022-12-05 DIAGNOSIS — S82851A Displaced trimalleolar fracture of right lower leg, initial encounter for closed fracture: Secondary | ICD-10-CM | POA: Diagnosis not present

## 2022-12-05 DIAGNOSIS — F419 Anxiety disorder, unspecified: Secondary | ICD-10-CM | POA: Insufficient documentation

## 2022-12-05 DIAGNOSIS — Z6830 Body mass index (BMI) 30.0-30.9, adult: Secondary | ICD-10-CM | POA: Diagnosis not present

## 2022-12-05 DIAGNOSIS — E669 Obesity, unspecified: Secondary | ICD-10-CM | POA: Diagnosis not present

## 2022-12-05 DIAGNOSIS — M1990 Unspecified osteoarthritis, unspecified site: Secondary | ICD-10-CM | POA: Diagnosis not present

## 2022-12-05 DIAGNOSIS — G8918 Other acute postprocedural pain: Secondary | ICD-10-CM | POA: Diagnosis not present

## 2022-12-05 DIAGNOSIS — K219 Gastro-esophageal reflux disease without esophagitis: Secondary | ICD-10-CM | POA: Diagnosis not present

## 2022-12-05 DIAGNOSIS — Z01818 Encounter for other preprocedural examination: Secondary | ICD-10-CM

## 2022-12-05 HISTORY — DX: Anxiety disorder, unspecified: F41.9

## 2022-12-05 HISTORY — PX: ORIF ANKLE FRACTURE: SHX5408

## 2022-12-05 LAB — SURGICAL PCR SCREEN
MRSA, PCR: NEGATIVE
Staphylococcus aureus: NEGATIVE

## 2022-12-05 SURGERY — OPEN REDUCTION INTERNAL FIXATION (ORIF) ANKLE FRACTURE
Anesthesia: General | Site: Ankle | Laterality: Right

## 2022-12-05 MED ORDER — TRANEXAMIC ACID-NACL 1000-0.7 MG/100ML-% IV SOLN
INTRAVENOUS | Status: AC
  Filled 2022-12-05: qty 100

## 2022-12-05 MED ORDER — CEFAZOLIN SODIUM-DEXTROSE 2-4 GM/100ML-% IV SOLN
INTRAVENOUS | Status: AC
  Filled 2022-12-05: qty 100

## 2022-12-05 MED ORDER — DEXAMETHASONE SODIUM PHOSPHATE 10 MG/ML IJ SOLN
INTRAMUSCULAR | Status: DC | PRN
  Administered 2022-12-05: 10 mg via INTRAVENOUS

## 2022-12-05 MED ORDER — FENTANYL CITRATE (PF) 250 MCG/5ML IJ SOLN
INTRAMUSCULAR | Status: AC
  Filled 2022-12-05: qty 5

## 2022-12-05 MED ORDER — SODIUM CHLORIDE 0.9% FLUSH: 10.0000 mL | Freq: Two times a day (BID) | INTRAVENOUS | Status: DC

## 2022-12-05 MED ORDER — ACETAMINOPHEN 500 MG PO TABS: 1000.0000 mg | ORAL_TABLET | Freq: Once | ORAL | Status: AC

## 2022-12-05 MED ORDER — PROPOFOL 10 MG/ML IV BOLUS
INTRAVENOUS | Status: AC
  Filled 2022-12-05: qty 20

## 2022-12-05 MED ORDER — ACETAMINOPHEN 500 MG PO TABS
ORAL_TABLET | ORAL | Status: AC
  Administered 2022-12-05: 1000 mg via ORAL
  Filled 2022-12-05: qty 2

## 2022-12-05 MED ORDER — DEXAMETHASONE SODIUM PHOSPHATE 4 MG/ML IJ SOLN
INTRAMUSCULAR | Status: DC | PRN
  Administered 2022-12-05: 3 mg via PERINEURAL
  Administered 2022-12-05: 2 mg via PERINEURAL

## 2022-12-05 MED ORDER — MIDAZOLAM HCL 2 MG/2ML IJ SOLN
INTRAMUSCULAR | Status: AC
  Filled 2022-12-05: qty 2

## 2022-12-05 MED ORDER — DROPERIDOL 2.5 MG/ML IJ SOLN: 0.6250 mg | Freq: Once | INTRAMUSCULAR | Status: DC | PRN

## 2022-12-05 MED ORDER — FENTANYL CITRATE (PF) 100 MCG/2ML IJ SOLN: 25.0000 ug | INTRAMUSCULAR | Status: DC | PRN

## 2022-12-05 MED ORDER — CEFAZOLIN SODIUM-DEXTROSE 2-4 GM/100ML-% IV SOLN
2.0000 g | INTRAVENOUS | Status: AC
  Administered 2022-12-05: 2 g via INTRAVENOUS

## 2022-12-05 MED ORDER — ONDANSETRON HCL 4 MG/2ML IJ SOLN
INTRAMUSCULAR | Status: DC | PRN
  Administered 2022-12-05: 4 mg via INTRAVENOUS

## 2022-12-05 MED ORDER — ROPIVACAINE HCL 5 MG/ML IJ SOLN
INTRAMUSCULAR | Status: DC | PRN
  Administered 2022-12-05: 25 mL via PERINEURAL
  Administered 2022-12-05: 17 mL via PERINEURAL

## 2022-12-05 MED ORDER — MIDAZOLAM HCL 2 MG/2ML IJ SOLN
INTRAMUSCULAR | Status: DC | PRN
  Administered 2022-12-05 (×2): 1 mg via INTRAVENOUS

## 2022-12-05 MED ORDER — PHENYLEPHRINE 80 MCG/ML (10ML) SYRINGE FOR IV PUSH (FOR BLOOD PRESSURE SUPPORT)
PREFILLED_SYRINGE | INTRAVENOUS | Status: DC | PRN
  Administered 2022-12-05: 80 ug via INTRAVENOUS
  Administered 2022-12-05: 160 ug via INTRAVENOUS
  Administered 2022-12-05: 80 ug via INTRAVENOUS

## 2022-12-05 MED ORDER — CHLORHEXIDINE GLUCONATE 0.12 % MT SOLN
OROMUCOSAL | Status: AC
  Administered 2022-12-05: 15 mL via OROMUCOSAL
  Filled 2022-12-05: qty 15

## 2022-12-05 MED ORDER — FENTANYL CITRATE (PF) 250 MCG/5ML IJ SOLN
INTRAMUSCULAR | Status: DC | PRN
  Administered 2022-12-05 (×3): 50 ug via INTRAVENOUS

## 2022-12-05 MED ORDER — LACTATED RINGERS IV SOLN: INTRAVENOUS | Status: DC | PRN

## 2022-12-05 MED ORDER — LIDOCAINE 2% (20 MG/ML) 5 ML SYRINGE
INTRAMUSCULAR | Status: DC | PRN
  Administered 2022-12-05: 100 mg via INTRAVENOUS

## 2022-12-05 MED ORDER — TRANEXAMIC ACID-NACL 1000-0.7 MG/100ML-% IV SOLN
1000.0000 mg | INTRAVENOUS | Status: AC
  Administered 2022-12-05: 1000 mg via INTRAVENOUS

## 2022-12-05 MED ORDER — CHLORHEXIDINE GLUCONATE 0.12 % MT SOLN
15.0000 mL | Freq: Once | OROMUCOSAL | Status: AC
Start: 2022-12-05 — End: 2022-12-05

## 2022-12-05 MED ORDER — 0.9 % SODIUM CHLORIDE (POUR BTL) OPTIME
TOPICAL | Status: DC | PRN
  Administered 2022-12-05: 1000 mL

## 2022-12-05 MED ORDER — GABAPENTIN 300 MG PO CAPS: 300.0000 mg | ORAL_CAPSULE | Freq: Once | ORAL | Status: AC

## 2022-12-05 MED ORDER — EPHEDRINE SULFATE-NACL 50-0.9 MG/10ML-% IV SOSY
PREFILLED_SYRINGE | INTRAVENOUS | Status: DC | PRN
  Administered 2022-12-05: 10 mg via INTRAVENOUS

## 2022-12-05 MED ORDER — GABAPENTIN 300 MG PO CAPS
ORAL_CAPSULE | ORAL | Status: AC
  Administered 2022-12-05: 300 mg via ORAL
  Filled 2022-12-05: qty 1

## 2022-12-05 MED ORDER — CLONIDINE HCL (ANALGESIA) 100 MCG/ML EP SOLN
EPIDURAL | Status: DC | PRN
Start: 2022-12-05 — End: 2022-12-05
  Administered 2022-12-05: 50 ug
  Administered 2022-12-05: 30 ug

## 2022-12-05 MED ORDER — PROPOFOL 10 MG/ML IV BOLUS
INTRAVENOUS | Status: DC | PRN
  Administered 2022-12-05: 140 mg via INTRAVENOUS

## 2022-12-05 MED ORDER — ORAL CARE MOUTH RINSE: 15.0000 mL | Freq: Once | OROMUCOSAL | Status: AC

## 2022-12-05 SURGICAL SUPPLY — 59 items
APL PRP STRL LF DISP 70% ISPRP (MISCELLANEOUS) ×1
BAG COUNTER SPONGE SURGICOUNT (BAG) ×1 IMPLANT
BAG SPNG CNTER NS LX DISP (BAG) ×1
BANDAGE ESMARK 6X9 LF (GAUZE/BANDAGES/DRESSINGS) IMPLANT
BIT DRILL 2.5MM SMALL QC EVOS (BIT) IMPLANT
BNDG CMPR 5X4 KNIT ELC UNQ LF (GAUZE/BANDAGES/DRESSINGS) ×1
BNDG CMPR 5X6 CHSV STRCH STRL (GAUZE/BANDAGES/DRESSINGS) ×1
BNDG CMPR 9X6 STRL LF SNTH (GAUZE/BANDAGES/DRESSINGS)
BNDG COHESIVE 4X5 TAN STRL (GAUZE/BANDAGES/DRESSINGS) ×1 IMPLANT
BNDG COHESIVE 6X5 TAN ST LF (GAUZE/BANDAGES/DRESSINGS) ×1 IMPLANT
BNDG ELASTIC 4INX 5YD STR LF (GAUZE/BANDAGES/DRESSINGS) IMPLANT
BNDG ESMARK 6X9 LF (GAUZE/BANDAGES/DRESSINGS)
BNDG GAUZE DERMACEA FLUFF 4 (GAUZE/BANDAGES/DRESSINGS) ×1 IMPLANT
BNDG GZE DERMACEA 4 6PLY (GAUZE/BANDAGES/DRESSINGS) ×1
CHLORAPREP W/TINT 26 (MISCELLANEOUS) IMPLANT
COVER SURGICAL LIGHT HANDLE (MISCELLANEOUS) ×1 IMPLANT
DRAPE C-ARM 42X120 X-RAY (DRAPES) ×1 IMPLANT
DRAPE U-SHAPE 47X51 STRL (DRAPES) ×1 IMPLANT
DRILL 2.5MM SMALL QC EVOS (BIT) ×1
DRSG ADAPTIC 3X8 NADH LF (GAUZE/BANDAGES/DRESSINGS) ×1 IMPLANT
ELECT REM PT RETURN 9FT ADLT (ELECTROSURGICAL) ×1
ELECTRODE REM PT RTRN 9FT ADLT (ELECTROSURGICAL) ×1 IMPLANT
GAUZE PAD ABD 8X10 STRL (GAUZE/BANDAGES/DRESSINGS) ×1 IMPLANT
GAUZE SPONGE 4X4 12PLY STRL (GAUZE/BANDAGES/DRESSINGS) ×1 IMPLANT
GAUZE XEROFORM 5X9 LF (GAUZE/BANDAGES/DRESSINGS) IMPLANT
GLOVE BIOGEL PI IND STRL 6.5 (GLOVE) ×1 IMPLANT
GLOVE BIOGEL PI IND STRL 8 (GLOVE) ×1 IMPLANT
GLOVE ECLIPSE 6.0 STRL STRAW (GLOVE) ×1 IMPLANT
GLOVE INDICATOR 8.0 STRL GRN (GLOVE) ×1 IMPLANT
GOWN STRL REUS W/ TWL LRG LVL3 (GOWN DISPOSABLE) ×1 IMPLANT
GOWN STRL REUS W/ TWL XL LVL3 (GOWN DISPOSABLE) ×3 IMPLANT
GOWN STRL REUS W/TWL LRG LVL3 (GOWN DISPOSABLE) ×1
GOWN STRL REUS W/TWL XL LVL3 (GOWN DISPOSABLE) ×3
K-WIRE 1.3X40 (WIRE) ×2
K-WIRE TROCAR PT 2.0 150MM (WIRE) ×2
KIT BASIN OR (CUSTOM PROCEDURE TRAY) ×1 IMPLANT
KIT TURNOVER KIT B (KITS) ×1 IMPLANT
KWIRE 1.3X40 (WIRE) IMPLANT
KWIRE TROCAR PT 2.0 150 (WIRE) IMPLANT
KWIRE TROCAR PT 2.0 150MM (WIRE) ×2 IMPLANT
MANIFOLD NEPTUNE II (INSTRUMENTS) ×1 IMPLANT
NS IRRIG 1000ML POUR BTL (IV SOLUTION) ×1 IMPLANT
PACK ORTHO EXTREMITY (CUSTOM PROCEDURE TRAY) ×1 IMPLANT
PAD ARMBOARD 7.5X6 YLW CONV (MISCELLANEOUS) ×2 IMPLANT
PLATE 6H 1/3 TUBULAR (Plate) IMPLANT
SCREW CANNULATED 4.1X40 (Screw) IMPLANT
SCREW CORT 3.5X10MM ST EVOS (Screw) IMPLANT
SCREW CORT 3.5X14 ST EVOS (Screw) IMPLANT
SCREW CORT 3.5X16 ST EVOS (Screw) IMPLANT
SCREW CORTEX 3.5X18 EVOS (Screw) IMPLANT
SUCTION TUBE FRAZIER 10FR DISP (SUCTIONS) ×1 IMPLANT
SUT ETHILON 3 0 FSL (SUTURE) ×2 IMPLANT
SUT VIC AB 0 CT1 27 (SUTURE) ×1
SUT VIC AB 0 CT1 27XBRD ANBCTR (SUTURE) IMPLANT
SUT VIC AB 2-0 CT1 27 (SUTURE) ×1
SUT VIC AB 2-0 CT1 TAPERPNT 27 (SUTURE) ×1 IMPLANT
TOWEL GREEN STERILE (TOWEL DISPOSABLE) ×1 IMPLANT
TOWEL GREEN STERILE FF (TOWEL DISPOSABLE) ×1 IMPLANT
TUBE CONNECTING 12X1/4 (SUCTIONS) ×1 IMPLANT

## 2022-12-05 NOTE — Anesthesia Procedure Notes (Addendum)
Anesthesia Regional Block: Adductor canal block   Pre-Anesthetic Checklist: , timeout performed,  Correct Patient, Correct Site, Correct Laterality,  Correct Procedure, Correct Position, site marked,  Risks and benefits discussed,  Surgical consent,  Pre-op evaluation,  At surgeon's request and post-op pain management  Laterality: Lower and Right  Prep: chloraprep       Needles:  Injection technique: Single-shot  Needle Type: Stimiplex     Needle Length: 9cm  Needle Gauge: 21     Additional Needles:   Procedures:,,,, ultrasound used (permanent image in chart),,    Narrative:  Start time: 12/05/2022 7:15 AM End time: 12/05/2022 7:35 AM Injection made incrementally with aspirations every 5 mL.  Performed by: Personally  Anesthesiologist: Lewie Loron, MD  Additional Notes: BP cuff, EKG monitors applied. Sedation begun. Artery and nerve location verified with ultrasound. Anesthetic injected incrementally (5ml), slowly, and after negative aspirations under direct u/s guidance. Good fascial/perineural spread. Tolerated well.

## 2022-12-05 NOTE — Transfer of Care (Signed)
Immediate Anesthesia Transfer of Care Note  Patient: Carmen Garcia  Procedure(s) Performed: RIGHT OPEN REDUCTION INTERNAL FIXATION (ORIF) ANKLE FRACTURE (Right: Ankle)  Patient Location: PACU  Anesthesia Type:GA combined with regional for post-op pain  Level of Consciousness: awake, alert , oriented, and patient cooperative  Airway & Oxygen Therapy: Patient Spontanous Breathing  Post-op Assessment: Report given to RN and Post -op Vital signs reviewed and stable  Post vital signs: Reviewed and stable  Last Vitals:  Vitals Value Taken Time  BP 150/88 12/05/22 0907  Temp    Pulse 108 12/05/22 0909  Resp 20 12/05/22 0909  SpO2 95 % 12/05/22 0909  Vitals shown include unfiled device data.  Last Pain:  Vitals:   12/05/22 0649  TempSrc: Oral         Complications: No notable events documented.

## 2022-12-05 NOTE — Anesthesia Postprocedure Evaluation (Signed)
Anesthesia Post Note  Patient: Carmen Garcia  Procedure(s) Performed: RIGHT OPEN REDUCTION INTERNAL FIXATION (ORIF) ANKLE FRACTURE (Right: Ankle)     Patient location during evaluation: PACU Anesthesia Type: General Level of consciousness: sedated and patient cooperative Pain management: pain level controlled Vital Signs Assessment: post-procedure vital signs reviewed and stable Respiratory status: spontaneous breathing Cardiovascular status: stable Anesthetic complications: no   No notable events documented.  Last Vitals:  Vitals:   12/05/22 0915 12/05/22 0930  BP: (!) 153/102 (!) 153/90  Pulse: (!) 105 95  Resp: 17 18  Temp:  36.8 C  SpO2: 91% 92%    Last Pain:  Vitals:   12/05/22 0930  TempSrc:   PainSc: 0-No pain                 Lewie Loron

## 2022-12-05 NOTE — Discharge Instructions (Signed)
     Discharge Instructions    Attending Surgeon: Huel Cote, MD Office Phone Number: [phone]   Diagnosis and Procedures:    Surgeries Performed: Right ankle bimalleolar ankle fracture  Discharge Plan:    Diet: Resume usual diet. Begin with light or bland foods.  Drink plenty of fluids.  Activity:  Weight bearing as tolerated right ankle. You are advised to go home directly from the hospital or surgical center. Restrict your activities.  GENERAL INSTRUCTIONS: 1.  Please apply ice to your wound to help with swelling and inflammation. This will improve your comfort and your overall recovery following surgery.     2. Please call Dr. Serena Croissant office at [phone] with questions Monday-Friday during business hours. If no one answers, please leave a message and someone should get back to the patient within 24 hours. For emergencies please call 911 or proceed to the emergency room.   3. Patient to notify surgical team if experiences any of the following: Bowel/Bladder dysfunction, uncontrolled pain, nerve/muscle weakness, incision with increased drainage or redness, nausea/vomiting and Fever greater than 101.0 F.  Be alert for signs of infection including redness, streaking, odor, fever or chills. Be alert for excessive pain or bleeding and notify your surgeon immediately.  WOUND INSTRUCTIONS:   Leave your dressing, cast, or splint in place until your post operative visit.  Keep it clean and dry.  Always keep the incision clean and dry until the staples/sutures are removed. If there is no drainage from the incision you should keep it open to air. If there is drainage from the incision you must keep it covered at all times until the drainage stops  Do not soak in a bath tub, hot tub, pool, lake or other body of water until 21 days after your surgery and your incision is completely dry and healed.  If you have removable sutures (or staples) they must be removed 10-14 days  (unless otherwise instructed) from the day of your surgery.     1)  Elevate the extremity as much as possible.  2)  Keep the dressing clean and dry.  3)  Please call us if the dressing becomes wet or dirty.  4)  If you are experiencing worsening pain or worsening swelling, please call.     MEDICATIONS: Resume all previous home medications at the previous prescribed dose and frequency unless otherwise noted Start taking the  pain medications on an as-needed basis as prescribed  Please taper down pain medication over the next week following surgery.  Ideally you should not require a refill of any narcotic pain medication.  Take pain medication with food to minimize nausea. In addition to the prescribed pain medication, you may take over-the-counter pain relievers such as Tylenol.  Do NOT take additional tylenol if your pain medication already has tylenol in it.  Aspirin 325mg  daily for four weeks.      FOLLOWUP INSTRUCTIONS: 1. Follow up at the Physical Therapy Clinic 3-4 days following surgery. This appointment should be scheduled unless other arrangements have been made.The Physical Therapy scheduling number is [phone] if an appointment has not already been arranged.  2. Contact Dr. Serena Croissant office during office hours at [phone] or the practice after hours line at [phone] for non-emergencies. For medical emergencies call 911.   Discharge Location: Home

## 2022-12-05 NOTE — Interval H&P Note (Signed)
History and Physical Interval Note:  12/05/2022 7:16 AM  Carmen Garcia  has presented today for surgery, with the diagnosis of RIGHT TRIMALLEOLAR FRACTURE.  The various methods of treatment have been discussed with the patient and family. After consideration of risks, benefits and other options for treatment, the patient has consented to  Procedure(s): RIGHT OPEN REDUCTION INTERNAL FIXATION (ORIF) ANKLE FRACTURE (Right) as a surgical intervention.  The patient's history has been reviewed, patient examined, no change in status, stable for surgery.  I have reviewed the patient's chart and labs.  Questions were answered to the patient's satisfaction.     Huel Cote

## 2022-12-05 NOTE — Anesthesia Procedure Notes (Signed)
Procedure Name: LMA Insertion Date/Time: 12/05/2022 7:59 AM  Performed by: Orlin Hilding, CRNAPre-anesthesia Checklist: Patient identified, Emergency Drugs available, Suction available, Timeout performed and Patient being monitored Patient Re-evaluated:Patient Re-evaluated prior to induction Oxygen Delivery Method: Circle system utilized Preoxygenation: Pre-oxygenation with 100% oxygen Induction Type: IV induction LMA: LMA inserted LMA Size: 4.0 Number of attempts: 1 Placement Confirmation: positive ETCO2 and breath sounds checked- equal and bilateral Tube secured with: Tape Comments: LMA placed very carefully to avoid front 2 teeth which are extremely loose   the tune is angled to the left to keep pressure off of the front teeth   In preop the patient was given a dental warning that her teeth could come out during the case but that all efforts would be made to have that not happen.  She expressed understanding

## 2022-12-05 NOTE — Anesthesia Procedure Notes (Signed)
Anesthesia Regional Block: Popliteal block   Pre-Anesthetic Checklist: , timeout performed,  Correct Patient, Correct Site, Correct Laterality,  Correct Procedure, Correct Position, site marked,  Risks and benefits discussed,  Surgical consent,  Pre-op evaluation,  At surgeon's request and post-op pain management  Laterality: Lower and Right  Prep: chloraprep       Needles:  Injection technique: Single-shot  Needle Type: Stimiplex     Needle Length: 10cm  Needle Gauge: 21     Additional Needles:   Procedures:,,,, ultrasound used (permanent image in chart),,   Motor weakness within 5 minutes.  Narrative:  Start time: 12/05/2022 7:35 AM End time: 12/05/2022 7:40 AM Injection made incrementally with aspirations every 5 mL.  Performed by: Personally  Anesthesiologist: Lewie Loron, MD  Additional Notes: Nerve located and needle positioned with direct ultrasound guidance. Good perineural spread. Patient tolerated well.

## 2022-12-05 NOTE — Brief Op Note (Signed)
   Brief Op Note  Date of Surgery: 12/05/2022  Preoperative Diagnosis: RIGHT TRIMALLEOLAR FRACTURE  Postoperative Diagnosis: same  Procedure: Procedure(s): RIGHT OPEN REDUCTION INTERNAL FIXATION (ORIF) ANKLE FRACTURE  Implants: Implant Name Type Inv. Item Serial No. Manufacturer Lot No. LRB No. Used Action  SCREW CORTEX 3.5X18 EVOS - [id number] Screw SCREW CORTEX 3.5X18 EVOS  [employer]  Right 3 Implanted  PLATE 6H 1/3 TUBULAR - [id number] Plate PLATE 6H 1/3 TUBULAR  [employer]  Right 1 Implanted  SCREW CORT 3.5X14 ST EVOS - [id number] Screw SCREW CORT 3.5X14 ST EVOS  [employer]vo AND [employer]  Right 1 Implanted  SCREW CORT 3.5X10MM ST EVOS - [id number] Screw SCREW CORT 3.5X10MM ST EVOS  Senna AND [employer]  Right 1 Implanted  SCREW CORT 3.5X16 ST EVOS - [id number] Screw SCREW CORT 3.5X16 ST EVOS  Fluellen AND [employer]  Right 1 Implanted  SCREW CANNULATED 4.1X40 - [id number] Screw SCREW CANNULATED 4.1X40  Treto AND [employer]  Right 2 Implanted    Surgeons: Surgeon(s): Huel Cote, MD  Anesthesia: General    Estimated Blood Loss: See anesthesia record  Complications: None  Condition to PACU: Stable  Benancio Deeds, MD 12/05/2022 9:05 AM

## 2022-12-05 NOTE — Op Note (Signed)
Date of Surgery: 12/05/2022  INDICATIONS: Ms. Witkin is a 57 y.o.-year-old female with right ankle displaced trimalleolar ankle fracture.  The risk and benefits of the procedure were discussed in detail and documented in the pre-operative evaluation.   PREOPERATIVE DIAGNOSIS: 1. Right ankle displaced trimalleolar ankle fracture  POSTOPERATIVE DIAGNOSIS: Same.  PROCEDURE: 1. Open reduction internal fixation lateral malleolus as well as medial malleolus  SURGEON: Benancio Deeds MD  ASSISTANT: Kerby Less, ATC  ANESTHESIA:  general  IV FLUIDS AND URINE: See anesthesia record.  ANTIBIOTICS: Ancef  ESTIMATED BLOOD LOSS: 15 mL.  IMPLANTS:  Implant Name Type Inv. Item Serial No. Manufacturer Lot No. LRB No. Used Action  SCREW CORTEX 3.5X18 EVOS - [id number] Screw SCREW CORTEX 3.5X18 EVOS  [employer]  Right 3 Implanted  PLATE 6H 1/3 TUBULAR - [id number] Plate PLATE 6H 1/3 TUBULAR  Fellenz AND [employer]  Right 1 Implanted  SCREW CORT 3.5X14 ST EVOS - [id number] Screw SCREW CORT 3.5X14 ST EVOS  [employer]isler [employer]  Right 1 Implanted  SCREW CORT 3.5X10MM ST EVOS - [id number] Screw SCREW CORT 3.5X10MM ST EVOS  Szilagyi AND [employer]  Right 1 Implanted  SCREW CORT 3.5X16 ST EVOS - [id number] Screw SCREW CORT 3.5X16 ST EVOS  [place] [employer]  Right 1 Implanted  SCREW CANNULATED 4.1X40 - [id number] Screw SCREW CANNULATED 4.1X40  Rallo [employer]  Right 2 Implanted    DRAINS: None  CULTURES: None  COMPLICATIONS: none  DESCRIPTION OF PROCEDURE:   I identified the patient in the pre-operative holding area.  I marked the operative right ankle with my initials. I reviewed the risks and benefits of the proposed surgical intervention and the patient (and/or patient's guardian) wished to proceed.  Anesthesia was then performed with a regional block.  The patient was transferred to the operative suite and placed in the supine  position with all bony prominences padded.     SCDs were placed on the non-operative lower extremity. Appropriate antibiotics was administered within 1 hour before incision. The operative extremity was then prepped and draped in standard fashion. A time out was performed confirming the correct extremity, correct patient and correct procedure.   A lateral incision over the fibula was made centered around the fracture. The incision was taken sharply down to bone. Full thickness anterior and posterior flaps were made. The fracture site was cleared of debris. Reduction clamps were used to mobilize the fracture ends and hold the fracture in place. Fluoroscopy was used to confirm proper alignment.  A one third tubular locking fibular plate was placed and fluoroscopy was used to confirm proper placement. The plate was secured with 3.8mm screws proximally and 3 screws distally. Fluoroscopy was used to confirm proper placement of screws.  Next, attention was turned to the medial side. A longitudinal incision was made along the anterior portion of the medial malleolus. The saphenous vein was protected and the incision was taken down to bone and full-thickness anterior and posterior flaps were created. The fracture site was cleared of debris. A dental pick was used to reduce the fracture. Two pins were placed from the distal aspect of the medial malleolus into the tibial bone with care not to penetrate the joint. The screws were overdrilled. Two 40mm partially-threaded 4.0 screws were placed. Fluoroscopy was used to confirm placement.  The wounds were thoroughly irrigated. The incisions were closed with vertical mattress 3-0 nylons. The incisions were dressed with xeroform, 4x4s, webril, and  bias. A boot was placed.     POSTOPERATIVE PLAN: She will be weightbearing as tolerated on the right ankle.  She was placed in a cam boot walker.  I will see her back in 2 weeks for suture removal.  She will be placed on  aspirin for blood clot infection  Benancio Deeds, MD 9:12 AM

## 2022-12-07 ENCOUNTER — Encounter (HOSPITAL_COMMUNITY): Payer: Self-pay | Admitting: Orthopaedic Surgery

## 2022-12-09 ENCOUNTER — Telehealth (HOSPITAL_BASED_OUTPATIENT_CLINIC_OR_DEPARTMENT_OTHER): Payer: Self-pay | Admitting: Orthopaedic Surgery

## 2022-12-09 ENCOUNTER — Other Ambulatory Visit (HOSPITAL_BASED_OUTPATIENT_CLINIC_OR_DEPARTMENT_OTHER): Payer: Self-pay | Admitting: Orthopaedic Surgery

## 2022-12-09 DIAGNOSIS — S82851A Displaced trimalleolar fracture of right lower leg, initial encounter for closed fracture: Secondary | ICD-10-CM

## 2022-12-09 MED ORDER — OXYCODONE HCL 5 MG PO TABS: 5.0000 mg | ORAL_TABLET | ORAL | 0 refills | Status: DC | PRN

## 2022-12-09 NOTE — Telephone Encounter (Signed)
Called patient and informed her meds were sent but reiterated to her to continue taking ibuprofen and tylenol and the oxycodone should only be taken for breakthrough pain and this will be her last refill. She understood completely

## 2022-12-09 NOTE — Telephone Encounter (Signed)
S/p ORIF ankle

## 2022-12-12 ENCOUNTER — Ambulatory Visit (HOSPITAL_BASED_OUTPATIENT_CLINIC_OR_DEPARTMENT_OTHER): Payer: Medicaid Other | Attending: Orthopaedic Surgery | Admitting: Physical Therapy

## 2022-12-12 ENCOUNTER — Other Ambulatory Visit: Payer: Self-pay

## 2022-12-12 DIAGNOSIS — R600 Localized edema: Secondary | ICD-10-CM | POA: Insufficient documentation

## 2022-12-12 DIAGNOSIS — S82851A Displaced trimalleolar fracture of right lower leg, initial encounter for closed fracture: Secondary | ICD-10-CM | POA: Insufficient documentation

## 2022-12-12 DIAGNOSIS — R262 Difficulty in walking, not elsewhere classified: Secondary | ICD-10-CM | POA: Diagnosis not present

## 2022-12-12 DIAGNOSIS — M6281 Muscle weakness (generalized): Secondary | ICD-10-CM | POA: Diagnosis not present

## 2022-12-12 DIAGNOSIS — X58XXXA Exposure to other specified factors, initial encounter: Secondary | ICD-10-CM | POA: Insufficient documentation

## 2022-12-12 DIAGNOSIS — M25571 Pain in right ankle and joints of right foot: Secondary | ICD-10-CM | POA: Insufficient documentation
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
## 2022-12-18 ENCOUNTER — Encounter (HOSPITAL_BASED_OUTPATIENT_CLINIC_OR_DEPARTMENT_OTHER): Payer: No Typology Code available for payment source | Admitting: Physical Therapy

## 2022-12-19 ENCOUNTER — Encounter (HOSPITAL_BASED_OUTPATIENT_CLINIC_OR_DEPARTMENT_OTHER): Payer: Self-pay | Admitting: Physical Therapy

## 2022-12-19 ENCOUNTER — Ambulatory Visit (HOSPITAL_BASED_OUTPATIENT_CLINIC_OR_DEPARTMENT_OTHER): Payer: Medicaid Other | Admitting: Physical Therapy

## 2022-12-19 DIAGNOSIS — S82851A Displaced trimalleolar fracture of right lower leg, initial encounter for closed fracture: Secondary | ICD-10-CM | POA: Diagnosis not present

## 2022-12-19 DIAGNOSIS — R262 Difficulty in walking, not elsewhere classified: Secondary | ICD-10-CM

## 2022-12-19 DIAGNOSIS — M25571 Pain in right ankle and joints of right foot: Secondary | ICD-10-CM

## 2022-12-19 DIAGNOSIS — M6281 Muscle weakness (generalized): Secondary | ICD-10-CM

## 2022-12-19 DIAGNOSIS — R600 Localized edema: Secondary | ICD-10-CM | POA: Diagnosis not present
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
## 2022-12-21 ENCOUNTER — Ambulatory Visit (HOSPITAL_BASED_OUTPATIENT_CLINIC_OR_DEPARTMENT_OTHER): Payer: Medicaid Other

## 2022-12-21 ENCOUNTER — Ambulatory Visit (INDEPENDENT_AMBULATORY_CARE_PROVIDER_SITE_OTHER): Payer: Medicaid Other | Admitting: Orthopaedic Surgery

## 2022-12-21 DIAGNOSIS — S82201A Unspecified fracture of shaft of right tibia, initial encounter for closed fracture: Secondary | ICD-10-CM | POA: Diagnosis not present

## 2022-12-21 DIAGNOSIS — S82851A Displaced trimalleolar fracture of right lower leg, initial encounter for closed fracture: Secondary | ICD-10-CM

## 2022-12-21 DIAGNOSIS — Z9889 Other specified postprocedural states: Secondary | ICD-10-CM | POA: Diagnosis not present

## 2022-12-21 DIAGNOSIS — S8251XA Displaced fracture of medial malleolus of right tibia, initial encounter for closed fracture: Secondary | ICD-10-CM | POA: Diagnosis not present

## 2022-12-21 DIAGNOSIS — S82401A Unspecified fracture of shaft of right fibula, initial encounter for closed fracture: Secondary | ICD-10-CM | POA: Diagnosis not present
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
## 2022-12-24 ENCOUNTER — Ambulatory Visit (HOSPITAL_BASED_OUTPATIENT_CLINIC_OR_DEPARTMENT_OTHER): Payer: Medicaid Other | Admitting: Physical Therapy

## 2022-12-25 ENCOUNTER — Telehealth: Payer: Self-pay

## 2022-12-25 ENCOUNTER — Ambulatory Visit
Admission: EM | Admit: 2022-12-25 | Discharge: 2022-12-25 | Disposition: A | Discharge status: Home / Self Care | Payer: Medicaid Other | Attending: Family Medicine | Admitting: Family Medicine

## 2022-12-25 DIAGNOSIS — N390 Urinary tract infection, site not specified: Secondary | ICD-10-CM | POA: Diagnosis not present

## 2022-12-25 LAB — POCT URINALYSIS DIP (MANUAL ENTRY)
Bilirubin, UA: NEGATIVE
Glucose, UA: NEGATIVE mg/dL
Ketones, POC UA: NEGATIVE mg/dL
Nitrite, UA: POSITIVE — AB
Protein Ur, POC: 30 mg/dL — AB
Spec Grav, UA: 1.015 (ref 1.010–1.025)
Urobilinogen, UA: 0.2 U/dL
pH, UA: 6.5 (ref 5.0–8.0)

## 2022-12-25 MED ORDER — CEPHALEXIN 500 MG PO CAPS: 500.0000 mg | ORAL_CAPSULE | Freq: Two times a day (BID) | ORAL | 0 refills | Status: DC

## 2022-12-25 MED ORDER — PHENAZOPYRIDINE HCL 100 MG PO TABS: 100.0000 mg | ORAL_TABLET | Freq: Three times a day (TID) | ORAL | 0 refills | Status: DC | PRN

## 2022-12-25 NOTE — Telephone Encounter (Signed)
Patient calls nurse line reporting UTI symptoms.   She reports symptoms started yesterday with dysuria, urinary frequency and flank pain.   She denies any fevers, chills or abdominal pain.   Patient advised she would need to be evaluated before we could send her in an antibiotic.   Patient advised to go to UC for evaluation as we do not have any apts left for today.   Patient agreed with plan.

## 2022-12-25 NOTE — ED Triage Notes (Signed)
Pt reports she has low back pain, low abdominal discomfort,burning with urination, pressure when urinating, and frequent urination x 3 days.    Took Cystex which gave some relief.
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
## 2022-12-25 NOTE — Telephone Encounter (Signed)
Noted and agree.  Carina Brown, MD  Family Medicine Teaching Service   

## 2022-12-27 LAB — URINE CULTURE: Culture: 80000 — AB

## 2023-01-01 ENCOUNTER — Telehealth: Payer: Self-pay

## 2023-01-01 MED ORDER — CEPHALEXIN 500 MG PO CAPS: 500.0000 mg | ORAL_CAPSULE | Freq: Four times a day (QID) | ORAL | 0 refills | Status: AC

## 2023-01-01 NOTE — Telephone Encounter (Signed)
Patient returns call to nurse line regarding concern.   Advised of message per PCP.   Patient has no further questions at this time.   Patient appreciative.   Veronda Prude, RN

## 2023-01-01 NOTE — Telephone Encounter (Signed)
Current Ecoli UTI is sensitive to current antibiotic and no need to change. Will send in 2 additional days of keflex. She should keep follow up appointment early next week if still having symptoms for urine recheck. If gets worse over the weekend, go to the ED.   Green team - please call patient and let her know.

## 2023-01-01 NOTE — Telephone Encounter (Signed)
Patient calls nurse line regarding continued issues with UTI. She was seen in UC on 12/25/22 for this concern. She reports that she has 1 pill left on cephalexin. She has started to experience back pain, urgency and painful urination. No current fever or chills.   She is asking if she needs to have abx refilled or how she should proceed. Recommended scheduling follow up visit in office due to continued symptoms.   We have no same day appointments available. Patient went ahead and scheduled PCP follow up on Tuesday, 01/05/23.  She is asking what she should do over the weekend. Will forward to PCP for further advisement.   Discussed ED precautions and supportive measures.   Veronda Prude, RN

## 2023-01-02 ENCOUNTER — Ambulatory Visit (HOSPITAL_BASED_OUTPATIENT_CLINIC_OR_DEPARTMENT_OTHER): Payer: Medicaid Other | Admitting: Physical Therapy

## 2023-01-04 ENCOUNTER — Ambulatory Visit (HOSPITAL_BASED_OUTPATIENT_CLINIC_OR_DEPARTMENT_OTHER): Payer: Medicaid Other | Admitting: Physical Therapy

## 2023-01-04 ENCOUNTER — Encounter (HOSPITAL_BASED_OUTPATIENT_CLINIC_OR_DEPARTMENT_OTHER): Payer: Self-pay | Admitting: Physical Therapy

## 2023-01-05 ENCOUNTER — Encounter: Payer: Self-pay | Admitting: Family Medicine

## 2023-01-05 ENCOUNTER — Ambulatory Visit: Payer: Medicaid Other | Admitting: Family Medicine

## 2023-01-05 VITALS — BP 142/90 | HR 98 | Ht 65.0 in | Wt 193.0 lb

## 2023-01-05 DIAGNOSIS — S82851D Displaced trimalleolar fracture of right lower leg, subsequent encounter for closed fracture with routine healing: Secondary | ICD-10-CM

## 2023-01-05 DIAGNOSIS — I10 Essential (primary) hypertension: Secondary | ICD-10-CM | POA: Diagnosis not present

## 2023-01-05 DIAGNOSIS — Z9889 Other specified postprocedural states: Secondary | ICD-10-CM

## 2023-01-05 DIAGNOSIS — F411 Generalized anxiety disorder: Secondary | ICD-10-CM | POA: Diagnosis not present

## 2023-01-05 DIAGNOSIS — G4700 Insomnia, unspecified: Secondary | ICD-10-CM

## 2023-01-05 DIAGNOSIS — R7401 Elevation of levels of liver transaminase levels: Secondary | ICD-10-CM | POA: Diagnosis not present

## 2023-01-05 DIAGNOSIS — R030 Elevated blood-pressure reading, without diagnosis of hypertension: Secondary | ICD-10-CM

## 2023-01-05 MED ORDER — LORAZEPAM 0.5 MG PO TABS: 0.5000 mg | ORAL_TABLET | Freq: Two times a day (BID) | ORAL | 0 refills | Status: DC | PRN

## 2023-01-05 MED ORDER — NORTRIPTYLINE HCL 25 MG PO CAPS: 25.0000 mg | ORAL_CAPSULE | Freq: Every day | ORAL | 0 refills | Status: DC

## 2023-01-05 MED ORDER — BLOOD PRESSURE CUFF MISC: 1.0000 | Freq: Every day | 0 refills | Status: AC

## 2023-01-05 NOTE — Patient Instructions (Addendum)
It was great to see you!  Our plans for today:  - Try the nortriptyline for your sleep and anxiety. See below for tips to help you sleep.  - Get a blood pressure cuff from [facility] ([address] [place], [place]). This should be $0 with your Medicaid. Check this every other day or so and keep a log of your readings. Make sure to take when you are resting for at least 5 minutes and relaxed. Bring your log with you to your next appointment. - Make an appointment for your pap smear. - Come back in 4-6 weeks for your anxiety and sleep f/u  We are checking some labs today, we will release these results to your MyChart.  Take care and seek immediate care sooner if you develop any concerns.   Dr. Linwood Dibbles   - Try the following to help you sleep better:  - limit naps during the day  - no screens (TV, phone, tablet, computer) at least 1-2 hours before bedtime.  - have a quiet and dark sleeping environment.  - no large meals or drinks about 1 hour before bed.  - Avoid taking diuretics (hydrochlorothiazide, furosemide) in the evenings.  - Avoid caffeine after 3pm.  - Exercise or move your body regularly every day.  - You can also try melatonin 5 mg over the counter. Take this 1-2 hours before bed. - If you are lying in bed for 30 mins-1 hour and aren't falling asleep, get out of bed and do something relaxing like reading (NO TV!) until you are tired.

## 2023-01-05 NOTE — Progress Notes (Unsigned)
   SUBJECTIVE:   CHIEF COMPLAINT / HPI:   R Trimalleolar ankle fracture - s/p ORIF 10/26. Now WBAT in cam boot. Working with PT.  Next ortho appt in a few weeks. Ambulating with cane. Not having to take oxycodone often, last took yesterday. Now taking ibuprofen/tylenol, helping some.   UTI - recent UC visit. 80k colonies of pansensitive Ecoli. Rx keflex and pyridium. Pressure and pain is gone. Finishing antibiotic tomorrow.   Anxiety - Medications: ativan BID prn - previously on ambien for insomnia. Previously tried zquil, melatonin. - previuosly tried prozac, lithium. Made feel like a zombie.  - mom previously on nortriptyline per chart review and worked well for her. Unsure if she has tried.  - Taking: ativan daily, mostly at night.  - FH of psych illness: mom with anxiety (now passed) - Symptoms: insomnia, jittery, constant worrying - Current stressors: driving  Elevated BP - elevated today, h/o elevated BP. Doesn't check at home, doesn't have cuff. Not currently on medication  H/o Tobacco use - No longer smoking since 10/10.   HM - needs colonoscopy, pap smear, shingles vaccine, flu/COVID.   OBJECTIVE:   BP (!) 142/90   Pulse 98   Ht 5\' 5"  (1.651 m)   Wt 193 lb (87.5 kg)   LMP 11/24/2014   SpO2 98%   BMI 32.12 kg/m   Gen: well appearing, in NAD Card: RRR Lungs: CTAB Ext: WWP, no edema   ASSESSMENT/PLAN:   Closed trimalleolar fracture of right ankle Continue with PT, ortho.  Insomnia Likely related to underlying anxiety. Will trial nortriptyline. Info provided on sleep hygiene.  Generalized anxiety disorder Start nortriptyline. Get PHQ/GAD at next visit.   Elevated blood pressure reading without diagnosis of hypertension Elevated today, improved on recheck though still slightly elevated. Question whether anxiety is contributing. Rx BP cuff to [facility]. Instructed to keep home log and RTC in 4 weeks for recheck.   HM - pap at next visit. Declined flu  and COVID vaccines.  F/u in 4 weeks for BP, insomnia, anxiety, pap smear.  Caro Laroche, DO

## 2023-01-06 ENCOUNTER — Encounter: Payer: Self-pay | Admitting: Family Medicine

## 2023-01-06 LAB — CBC
Hematocrit: 43.2 % (ref 34.0–46.6)
Hemoglobin: 14.4 g/dL (ref 11.1–15.9)
MCH: 32.1 pg (ref 26.6–33.0)
MCHC: 33.3 g/dL (ref 31.5–35.7)
MCV: 96 fL (ref 79–97)
Platelets: 313 10*3/uL (ref 150–450)
RBC: 4.48 x10E6/uL (ref 3.77–5.28)
RDW: 10.9 % — ABNORMAL LOW (ref 11.7–15.4)
WBC: 8.5 10*3/uL (ref 3.4–10.8)

## 2023-01-06 LAB — COMPREHENSIVE METABOLIC PANEL
ALT: 20 [IU]/L (ref 0–32)
AST: 21 [IU]/L (ref 0–40)
Albumin: 4.5 g/dL (ref 3.8–4.9)
Alkaline Phosphatase: 100 [IU]/L (ref 44–121)
BUN/Creatinine Ratio: 18 (ref 9–23)
BUN: 14 mg/dL (ref 6–24)
Bilirubin Total: 0.4 mg/dL (ref 0.0–1.2)
CO2: 22 mmol/L (ref 20–29)
Calcium: 10.3 mg/dL — ABNORMAL HIGH (ref 8.7–10.2)
Chloride: 104 mmol/L (ref 96–106)
Creatinine, Ser: 0.8 mg/dL (ref 0.57–1.00)
Globulin, Total: 2.5 g/dL (ref 1.5–4.5)
Glucose: 105 mg/dL — ABNORMAL HIGH (ref 70–99)
Potassium: 4.8 mmol/L (ref 3.5–5.2)
Sodium: 142 mmol/L (ref 134–144)
Total Protein: 7 g/dL (ref 6.0–8.5)
eGFR: 85 mL/min/{1.73_m2} (ref >59)

## 2023-01-06 NOTE — Assessment & Plan Note (Signed)
Likely related to underlying anxiety. Will trial nortriptyline. Info provided on sleep hygiene.

## 2023-01-06 NOTE — Assessment & Plan Note (Signed)
Start nortriptyline. Get PHQ/GAD at next visit.

## 2023-01-06 NOTE — Assessment & Plan Note (Signed)
Elevated today, improved on recheck though still slightly elevated. Question whether anxiety is contributing. Rx BP cuff to [facility]. Instructed to keep home log and RTC in 4 weeks for recheck.

## 2023-01-06 NOTE — Assessment & Plan Note (Signed)
Continue with PT, ortho.

## 2023-01-09 ENCOUNTER — Ambulatory Visit (HOSPITAL_BASED_OUTPATIENT_CLINIC_OR_DEPARTMENT_OTHER): Payer: Medicaid Other | Admitting: Physical Therapy

## 2023-01-09 ENCOUNTER — Telehealth (HOSPITAL_BASED_OUTPATIENT_CLINIC_OR_DEPARTMENT_OTHER): Payer: Self-pay | Admitting: Physical Therapy

## 2023-01-09 NOTE — Telephone Encounter (Signed)
Spoke with pt. She reports she was unable to attend this AM due to migraine. Advised of appt on wed and she states she does not have a way to attend and asked that we please stop calling. I asked if she would like for me to cancel her appts and she can call back to schedule appointments at her convenience- she agreed to this. All appts will be cancelled.   Fillmore Bynum C. Loyed Wilmes PT, DPT 01/09/23 9:31 AM

## 2023-01-13 ENCOUNTER — Ambulatory Visit (HOSPITAL_BASED_OUTPATIENT_CLINIC_OR_DEPARTMENT_OTHER): Payer: Medicaid Other | Admitting: Physical Therapy

## 2023-01-16 ENCOUNTER — Encounter (HOSPITAL_BASED_OUTPATIENT_CLINIC_OR_DEPARTMENT_OTHER): Payer: No Typology Code available for payment source | Admitting: Physical Therapy

## 2023-01-20 ENCOUNTER — Encounter (HOSPITAL_BASED_OUTPATIENT_CLINIC_OR_DEPARTMENT_OTHER): Payer: Medicaid Other | Admitting: Physical Therapy

## 2023-01-23 ENCOUNTER — Encounter (HOSPITAL_BASED_OUTPATIENT_CLINIC_OR_DEPARTMENT_OTHER): Payer: Medicaid Other | Admitting: Physical Therapy

## 2023-01-26 ENCOUNTER — Other Ambulatory Visit: Payer: Self-pay | Admitting: Family Medicine

## 2023-01-27 ENCOUNTER — Ambulatory Visit (HOSPITAL_BASED_OUTPATIENT_CLINIC_OR_DEPARTMENT_OTHER): Payer: Medicaid Other | Admitting: Orthopaedic Surgery

## 2023-01-27 ENCOUNTER — Encounter (HOSPITAL_BASED_OUTPATIENT_CLINIC_OR_DEPARTMENT_OTHER): Payer: Medicaid Other | Admitting: Physical Therapy

## 2023-01-27 ENCOUNTER — Ambulatory Visit (HOSPITAL_BASED_OUTPATIENT_CLINIC_OR_DEPARTMENT_OTHER): Payer: Medicaid Other

## 2023-01-27 DIAGNOSIS — S82851A Displaced trimalleolar fracture of right lower leg, initial encounter for closed fracture: Secondary | ICD-10-CM

## 2023-01-27 DIAGNOSIS — S8251XD Displaced fracture of medial malleolus of right tibia, subsequent encounter for closed fracture with routine healing: Secondary | ICD-10-CM | POA: Diagnosis not present

## 2023-01-27 DIAGNOSIS — S82401D Unspecified fracture of shaft of right fibula, subsequent encounter for closed fracture with routine healing: Secondary | ICD-10-CM | POA: Diagnosis not present

## 2023-01-27 DIAGNOSIS — S82851D Displaced trimalleolar fracture of right lower leg, subsequent encounter for closed fracture with routine healing: Secondary | ICD-10-CM | POA: Diagnosis not present
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
## 2023-01-30 ENCOUNTER — Encounter (HOSPITAL_BASED_OUTPATIENT_CLINIC_OR_DEPARTMENT_OTHER): Payer: Medicaid Other | Admitting: Physical Therapy

## 2023-02-04 ENCOUNTER — Encounter (HOSPITAL_BASED_OUTPATIENT_CLINIC_OR_DEPARTMENT_OTHER): Payer: Medicaid Other | Admitting: Physical Therapy

## 2023-02-06 ENCOUNTER — Encounter (HOSPITAL_BASED_OUTPATIENT_CLINIC_OR_DEPARTMENT_OTHER): Payer: Medicaid Other | Admitting: Physical Therapy

## 2023-02-11 ENCOUNTER — Encounter (HOSPITAL_BASED_OUTPATIENT_CLINIC_OR_DEPARTMENT_OTHER): Payer: Medicaid Other | Admitting: Physical Therapy

## 2023-02-13 ENCOUNTER — Encounter (HOSPITAL_BASED_OUTPATIENT_CLINIC_OR_DEPARTMENT_OTHER): Payer: Medicaid Other | Admitting: Physical Therapy

## 2023-02-17 ENCOUNTER — Encounter (HOSPITAL_BASED_OUTPATIENT_CLINIC_OR_DEPARTMENT_OTHER): Payer: Medicaid Other | Admitting: Physical Therapy

## 2023-02-19 ENCOUNTER — Ambulatory Visit: Payer: Medicaid Other | Admitting: Family Medicine

## 2023-02-20 ENCOUNTER — Encounter (HOSPITAL_BASED_OUTPATIENT_CLINIC_OR_DEPARTMENT_OTHER): Payer: Medicaid Other | Admitting: Physical Therapy

## 2023-02-24 ENCOUNTER — Encounter (HOSPITAL_BASED_OUTPATIENT_CLINIC_OR_DEPARTMENT_OTHER): Payer: Medicaid Other | Admitting: Physical Therapy

## 2023-02-25 ENCOUNTER — Other Ambulatory Visit: Payer: Self-pay | Admitting: Family Medicine

## 2023-02-26 ENCOUNTER — Ambulatory Visit: Payer: Medicaid Other | Admitting: Family Medicine

## 2023-02-26 NOTE — Progress Notes (Deleted)
    SUBJECTIVE:   CHIEF COMPLAINT / HPI:   Elevated BP: - Medications: *** - Compliance: *** - Checking BP at home: *** - Denies any SOB, CP, vision changes, LE edema, medication SEs, or symptoms of hypotension - Diet: *** - Exercise: ***  Anxiety, Insomnia - Medications: nortriptyline, ativan - Taking: *** - Counseling: *** - Previous hospitalizations: *** - FH of psych illness: *** - Symptoms: *** - Current stressors: *** - Coping Mechanisms: ***   HM - due for colonoscopy, pap  OBJECTIVE:   LMP 11/24/2014   ***  ASSESSMENT/PLAN:   No problem-specific Assessment & Plan notes found for this encounter.     Caro Laroche, DO

## 2023-02-27 ENCOUNTER — Encounter (HOSPITAL_BASED_OUTPATIENT_CLINIC_OR_DEPARTMENT_OTHER): Payer: Medicaid Other | Admitting: Physical Therapy

## 2023-03-03 ENCOUNTER — Encounter (HOSPITAL_BASED_OUTPATIENT_CLINIC_OR_DEPARTMENT_OTHER): Payer: Medicaid Other | Admitting: Physical Therapy

## 2023-03-06 ENCOUNTER — Encounter (HOSPITAL_BASED_OUTPATIENT_CLINIC_OR_DEPARTMENT_OTHER): Payer: Medicaid Other | Admitting: Physical Therapy

## 2023-03-08 ENCOUNTER — Other Ambulatory Visit: Payer: Self-pay | Admitting: Family Medicine

## 2023-03-08 DIAGNOSIS — Z0000 Encounter for general adult medical examination without abnormal findings: Secondary | ICD-10-CM

## 2023-03-09 ENCOUNTER — Ambulatory Visit: Payer: Medicaid Other

## 2023-03-10 ENCOUNTER — Ambulatory Visit (HOSPITAL_BASED_OUTPATIENT_CLINIC_OR_DEPARTMENT_OTHER): Payer: Medicaid Other | Admitting: Orthopaedic Surgery

## 2023-03-10 ENCOUNTER — Encounter (HOSPITAL_BASED_OUTPATIENT_CLINIC_OR_DEPARTMENT_OTHER): Payer: Self-pay | Admitting: Orthopaedic Surgery

## 2023-03-10 ENCOUNTER — Encounter (HOSPITAL_BASED_OUTPATIENT_CLINIC_OR_DEPARTMENT_OTHER): Payer: Medicaid Other | Admitting: Physical Therapy

## 2023-03-10 ENCOUNTER — Ambulatory Visit (HOSPITAL_BASED_OUTPATIENT_CLINIC_OR_DEPARTMENT_OTHER): Payer: Medicaid Other

## 2023-03-10 DIAGNOSIS — S82851A Displaced trimalleolar fracture of right lower leg, initial encounter for closed fracture: Secondary | ICD-10-CM

## 2023-03-10 DIAGNOSIS — Z9889 Other specified postprocedural states: Secondary | ICD-10-CM | POA: Diagnosis not present

## 2023-03-10 DIAGNOSIS — S8251XA Displaced fracture of medial malleolus of right tibia, initial encounter for closed fracture: Secondary | ICD-10-CM | POA: Diagnosis not present

## 2023-03-10 DIAGNOSIS — S82401A Unspecified fracture of shaft of right fibula, initial encounter for closed fracture: Secondary | ICD-10-CM | POA: Diagnosis not present
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
## 2023-03-12 ENCOUNTER — Encounter: Payer: Self-pay | Admitting: Family Medicine

## 2023-03-12 ENCOUNTER — Other Ambulatory Visit (HOSPITAL_COMMUNITY)
Admission: RE | Admit: 2023-03-12 | Discharge: 2023-03-12 | Disposition: A | Discharge status: Home / Self Care | Payer: Medicaid Other | Source: Ambulatory Visit | Attending: Family Medicine | Admitting: Family Medicine

## 2023-03-12 ENCOUNTER — Ambulatory Visit: Payer: Medicaid Other | Admitting: Family Medicine

## 2023-03-12 VITALS — BP 140/80 | HR 96 | Ht 63.0 in | Wt 194.8 lb

## 2023-03-12 DIAGNOSIS — Z01419 Encounter for gynecological examination (general) (routine) without abnormal findings: Secondary | ICD-10-CM

## 2023-03-12 DIAGNOSIS — F411 Generalized anxiety disorder: Secondary | ICD-10-CM

## 2023-03-12 DIAGNOSIS — S82851D Displaced trimalleolar fracture of right lower leg, subsequent encounter for closed fracture with routine healing: Secondary | ICD-10-CM

## 2023-03-12 DIAGNOSIS — R030 Elevated blood-pressure reading, without diagnosis of hypertension: Secondary | ICD-10-CM | POA: Diagnosis not present

## 2023-03-12 DIAGNOSIS — G4700 Insomnia, unspecified: Secondary | ICD-10-CM

## 2023-03-12 DIAGNOSIS — Z8742 Personal history of other diseases of the female genital tract: Secondary | ICD-10-CM | POA: Diagnosis not present

## 2023-03-12 DIAGNOSIS — F17200 Nicotine dependence, unspecified, uncomplicated: Secondary | ICD-10-CM | POA: Diagnosis not present

## 2023-03-12 NOTE — Assessment & Plan Note (Signed)
Remains uncontrolled. Resistant to trying different medication at this time. Wants to get pap smear behind her first.

## 2023-03-12 NOTE — Assessment & Plan Note (Signed)
Repeat PAP done today  

## 2023-03-12 NOTE — Assessment & Plan Note (Signed)
Has cut down. Recommended complete cessation as able. Getting anxiety under better control will help.

## 2023-03-12 NOTE — Assessment & Plan Note (Signed)
Well healing. Continue to follow with ortho.

## 2023-03-12 NOTE — Progress Notes (Signed)
   SUBJECTIVE:   CHIEF COMPLAINT / HPI:   Elevated BP: - Medications: none - Checking BP at home: no. Prescribed a cuff at last visit. - stressed today, anxious to hear about pap results.  Anxiety, Insomnia - Medications: nortriptyline, ativan prn - previously on ambien for insomnia. Previously tried zquil, melatonin. - previuosly tried prozac, lithium. Made feel like a zombie. - Taking: only took nortriptyline for a few weeks, had constipation. Better once stopped taking. - does not want to be on medication for right now.   Need for pap smear - last NILM with +HPV 12/2021. Recommended 1 year f/u.   Tobacco use - had quit for ankle surgery. Now smoking occasionally when stressed. Usually about 1 cigarette few times per week.   HM - due for colonoscopy, pap.  OBJECTIVE:   BP (!) 140/80   Pulse 96   Ht 5\' 3"  (1.6 m)   Wt 194 lb 12.8 oz (88.4 kg)   LMP 11/24/2014   SpO2 99%   BMI 34.51 kg/m   Gen: well appearing, in NAD Card: RRR Lungs: CTAB Abd: soft, NTND, +BS. GYN:  External genitalia within normal limits.  Vaginal mucosa pink, moist, normal rugae.  Nonfriable cervix without lesions, no discharge or bleeding noted on speculum exam.  Bimanual exam revealed normal, nongravid uterus.  No cervical motion tenderness. No adnexal masses bilaterally.   Ext: WWP, no edema   ASSESSMENT/PLAN:   History of abnormal cervical Pap smear Repeat PAP done today.  Generalized anxiety disorder Remains uncontrolled. Resistant to trying different medication at this time. Wants to get pap smear behind her first.   Elevated blood pressure reading without diagnosis of hypertension Remains elevated today but anxious and no home readings for comparison. Recommend home monitoring and f/u 1 month with log for recheck.   Closed trimalleolar fracture of right ankle Well healing. Continue to follow with ortho.   Tobacco use disorder Has cut down. Recommended complete cessation as able.  Getting anxiety under better control will help.    F/u 1 month for BP, anxiety.   Caro Laroche, DO

## 2023-03-12 NOTE — Assessment & Plan Note (Signed)
Remains elevated today but anxious and no home readings for comparison. Recommend home monitoring and f/u 1 month with log for recheck.

## 2023-03-12 NOTE — Patient Instructions (Signed)
It was great to see you!  Our plans for today:  - We completed your pap smear today. We will release these results to your MyChart.  - Monitor your blood pressure at home and keep a log of your readings. Make sure to be seated for at least 5 minutes prior to testing and not in pain or worked up for the most accurate readings. Bring this log with you to follow up.  - Come back in 1 month for your blood pressure.  Take care and seek immediate care sooner if you develop any concerns.   Dr. Linwood Dibbles

## 2023-03-13 ENCOUNTER — Encounter (HOSPITAL_BASED_OUTPATIENT_CLINIC_OR_DEPARTMENT_OTHER): Payer: Medicaid Other | Admitting: Physical Therapy

## 2023-03-17 ENCOUNTER — Encounter (HOSPITAL_BASED_OUTPATIENT_CLINIC_OR_DEPARTMENT_OTHER): Payer: Medicaid Other | Admitting: Physical Therapy

## 2023-03-17 ENCOUNTER — Other Ambulatory Visit: Payer: Self-pay | Admitting: Family Medicine

## 2023-03-18 LAB — CYTOLOGY - PAP
Adequacy: ABSENT
Chlamydia: NEGATIVE
Comment: NEGATIVE
Comment: NEGATIVE
Comment: NEGATIVE
Comment: NORMAL
Diagnosis: UNDETERMINED — AB
High risk HPV: POSITIVE — AB
Neisseria Gonorrhea: NEGATIVE
Trichomonas: NEGATIVE

## 2023-03-23 ENCOUNTER — Ambulatory Visit: Payer: Medicaid Other

## 2023-03-30 ENCOUNTER — Telehealth: Payer: Self-pay

## 2023-03-30 ENCOUNTER — Other Ambulatory Visit: Payer: Self-pay | Admitting: Family Medicine

## 2023-03-30 NOTE — Telephone Encounter (Signed)
Patient LVM on nurse requesting PCP to call her ASAP.  I called patient and she reports her ativan prescription was denied.   I advised her we sent in a 15 day supply on 2/6 and the prescription is too soon to fill.   She she apologized and stated she "mixed up" the dates.   No further action needed.

## 2023-04-01 ENCOUNTER — Ambulatory Visit: Payer: Medicaid Other

## 2023-04-05 ENCOUNTER — Other Ambulatory Visit: Payer: Self-pay | Admitting: Family Medicine

## 2023-04-05 NOTE — Telephone Encounter (Signed)
 Unclear if 30 tablets 30 or 15 days supply. Will await PCP return.   Terisa Starr, MD  Family Medicine Teaching Service

## 2023-04-08 ENCOUNTER — Ambulatory Visit: Payer: Medicaid Other

## 2023-04-08 VITALS — BP 158/92 | HR 94 | Wt 192.0 lb

## 2023-04-08 DIAGNOSIS — B977 Papillomavirus as the cause of diseases classified elsewhere: Secondary | ICD-10-CM

## 2023-04-08 DIAGNOSIS — R87610 Atypical squamous cells of undetermined significance on cytologic smear of cervix (ASC-US): Secondary | ICD-10-CM

## 2023-04-09 DIAGNOSIS — R87610 Atypical squamous cells of undetermined significance on cytologic smear of cervix (ASC-US): Secondary | ICD-10-CM | POA: Insufficient documentation

## 2023-04-09 NOTE — Progress Notes (Signed)
 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  Placed in lithotomy position. Cervix viewed with speculum and colposcope after application of acetic acid.   Colposcopy adequate (entire squamocolumnar junctions seen  in entirety) ?  Yes Acetowhite lesions?  No Punctation?  None Mosaicism?  None Abnormal vasculature?  None Biopsies?  None ECC?  No Complications?  None  COMMENTS: Patient was given post procedure instructions.    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    PLAN:  - Would recommend repeat Pap smear with automatic cotesting at next office visit in 1 year.  Could have this done with her PCP or with women's health clinic here.   - Could consider 16/18 subtyping although with absolutely normal colposcopy I doubt that that is necessary at this time.   - Discussed findings with her and reassured her.  She was quite happy with negative findings today.  Agrees to follow-up.  Answered all her questions.

## 2023-04-13 ENCOUNTER — Ambulatory Visit: Payer: Medicaid Other

## 2023-04-23 ENCOUNTER — Other Ambulatory Visit: Payer: Self-pay | Admitting: Family Medicine

## 2023-05-10 ENCOUNTER — Ambulatory Visit: Admitting: Family Medicine

## 2023-05-19 ENCOUNTER — Other Ambulatory Visit: Payer: Self-pay | Admitting: Family Medicine

## 2023-05-31 ENCOUNTER — Telehealth: Payer: Self-pay

## 2023-05-31 ENCOUNTER — Encounter: Payer: Self-pay | Admitting: Family Medicine

## 2023-05-31 ENCOUNTER — Other Ambulatory Visit (HOSPITAL_COMMUNITY): Payer: Self-pay

## 2023-05-31 ENCOUNTER — Ambulatory Visit: Admitting: Family Medicine

## 2023-05-31 VITALS — BP 160/80 | HR 93 | Ht 65.0 in | Wt 191.6 lb

## 2023-05-31 DIAGNOSIS — F411 Generalized anxiety disorder: Secondary | ICD-10-CM

## 2023-05-31 DIAGNOSIS — M546 Pain in thoracic spine: Secondary | ICD-10-CM

## 2023-05-31 DIAGNOSIS — Z833 Family history of diabetes mellitus: Secondary | ICD-10-CM | POA: Diagnosis not present

## 2023-05-31 DIAGNOSIS — E66811 Obesity, class 1: Secondary | ICD-10-CM

## 2023-05-31 DIAGNOSIS — Z6831 Body mass index (BMI) 31.0-31.9, adult: Secondary | ICD-10-CM | POA: Diagnosis not present

## 2023-05-31 DIAGNOSIS — R030 Elevated blood-pressure reading, without diagnosis of hypertension: Secondary | ICD-10-CM

## 2023-05-31 LAB — POCT GLYCOSYLATED HEMOGLOBIN (HGB A1C): Hemoglobin A1C: 5.5 % (ref 4.0–5.6)

## 2023-05-31 MED ORDER — TIZANIDINE HCL 4 MG PO TABS
4.0000 mg | ORAL_TABLET | Freq: Two times a day (BID) | ORAL | 0 refills | Status: AC | PRN
Start: 2023-05-31 — End: ?

## 2023-05-31 MED ORDER — LORAZEPAM 0.5 MG PO TABS: 0.5000 mg | ORAL_TABLET | Freq: Two times a day (BID) | ORAL | 1 refills | Status: DC | PRN

## 2023-05-31 MED ORDER — LOSARTAN POTASSIUM 25 MG PO TABS: 25.0000 mg | ORAL_TABLET | Freq: Every day | ORAL | 0 refills | Status: DC

## 2023-05-31 MED ORDER — TIZANIDINE HCL 4 MG PO CAPS: 4.0000 mg | ORAL_CAPSULE | Freq: Two times a day (BID) | ORAL | 0 refills | Status: DC | PRN

## 2023-05-31 NOTE — Assessment & Plan Note (Signed)
 Subacute. Rx short course of muscle relaxer. Discussed OMT options.

## 2023-05-31 NOTE — Progress Notes (Signed)
   SUBJECTIVE:   CHIEF COMPLAINT / HPI:   Elevated BP - Medications: none - Checking BP at home: no. Prescribed a cuff at previous visit. - working on getting in 10k steps per day, has fitbit.  Anxiety, Insomnia - Medications: ativan  prn. Takes THC gummies.  - previously on ambien  for insomnia. Previously tried zquil, melatonin. - previuosly tried prozac, lithium. Made feel like a zombie. - previously tried nortriptyline , had constipation. - Taking: ativan  BID. - FH of psych illness: mom with anxiety (now passed) - Symptoms: insomnia, jittery, constant worrying - Current stressors: driving - Coping mechanisms: keeping busy.  Back pain - few week duration. Mid thoracic location, L sided. Notices when moves. Building gazebo with husband.  Heat and ibuprofen  helps.    OBJECTIVE:   BP (!) 160/80   Pulse 93   Ht 5\' 5"  (1.651 m)   Wt 191 lb 9.6 oz (86.9 kg)   LMP 11/24/2014   SpO2 100%   BMI 31.88 kg/m   Gen: well appearing, in NAD Card: RRR Lungs: CTAB MSK: no midline spinal tenderness or step offs. +paravertebral hypertonicity to L sided thoracic musculature, nonTTP.  Ext: WWP, no edema     05/31/2023   11:24 AM 01/05/2023   11:14 AM  GAD 7 : Generalized Anxiety Score  Nervous, Anxious, on Edge 1 1  Control/stop worrying 0 0  Worry too much - different things 1 1  Trouble relaxing 1   Restless 0 0  Easily annoyed or irritable 2 1  Afraid - awful might happen 1 1  Total GAD 7 Score 6   Anxiety Difficulty Somewhat difficult       ASSESSMENT/PLAN:   Elevated blood pressure reading without diagnosis of hypertension Remains elevated. Start losartan . Obtain BMP. F/u 1 month.  Generalized anxiety disorder Remains uncontrolled. Suspect GAD scoring lower than actual. Discussed maintence therapy, patient markedly hesitant to start. Will continue to reassess readiness. Continues on prn BZD.  Thoracic back pain Subacute. Rx short course of muscle relaxer. Discussed  OMT options.      Kandis Ormond, DO

## 2023-05-31 NOTE — Telephone Encounter (Signed)
 Done

## 2023-05-31 NOTE — Assessment & Plan Note (Signed)
 Remains elevated. Start losartan . Obtain BMP. F/u 1 month.

## 2023-05-31 NOTE — Patient Instructions (Addendum)
 It was great to see you!  Our plans for today:  - Consider an OMT visit 5/16 morning for your back pain. Take the muscle relaxer as needed before bed for pain. - Take the losartan  for your blood pressure.  - Monitor your blood pressure at home and keep a log of your readings. Make sure to be seated for at least 5 minutes prior to testing and not in pain or worked up for the most accurate readings. Bring this log with you to follow up.  - Come back in 1 month.   We are checking some labs today, we will release these results to your MyChart.  Take care and seek immediate care sooner if you develop any concerns.   Dr. Tyhir Schwan

## 2023-05-31 NOTE — Assessment & Plan Note (Addendum)
 Remains uncontrolled. Suspect GAD scoring lower than actual. Discussed maintence therapy, patient markedly hesitant to start. Will continue to reassess readiness. Continues on prn BZD.

## 2023-05-31 NOTE — Telephone Encounter (Signed)
 Rec'd PA request for patients Tizanidine  capsules.   Tablets covered by insurance. Please resend for tablets if applicable. Thanks!

## 2023-06-01 ENCOUNTER — Encounter: Payer: Self-pay | Admitting: Family Medicine

## 2023-06-01 LAB — BASIC METABOLIC PANEL WITH GFR
BUN/Creatinine Ratio: 10 (ref 9–23)
BUN: 9 mg/dL (ref 6–24)
CO2: 21 mmol/L (ref 20–29)
Calcium: 10 mg/dL (ref 8.7–10.2)
Chloride: 102 mmol/L (ref 96–106)
Creatinine, Ser: 0.89 mg/dL (ref 0.57–1.00)
Glucose: 117 mg/dL — ABNORMAL HIGH (ref 70–99)
Potassium: 4.5 mmol/L (ref 3.5–5.2)
Sodium: 141 mmol/L (ref 134–144)
eGFR: 75 mL/min/{1.73_m2} (ref >59)

## 2023-06-01 LAB — LIPID PANEL
Chol/HDL Ratio: 4.2 ratio (ref 0.0–4.4)
Cholesterol, Total: 265 mg/dL — ABNORMAL HIGH (ref 100–199)
HDL: 63 mg/dL (ref >39)
LDL Chol Calc (NIH): 162 mg/dL — ABNORMAL HIGH (ref 0–99)
Triglycerides: 218 mg/dL — ABNORMAL HIGH (ref 0–149)
VLDL Cholesterol Cal: 40 mg/dL (ref 5–40)

## 2023-06-09 ENCOUNTER — Ambulatory Visit (HOSPITAL_BASED_OUTPATIENT_CLINIC_OR_DEPARTMENT_OTHER)

## 2023-06-09 ENCOUNTER — Ambulatory Visit (HOSPITAL_BASED_OUTPATIENT_CLINIC_OR_DEPARTMENT_OTHER): Payer: Medicaid Other | Admitting: Orthopaedic Surgery

## 2023-06-09 DIAGNOSIS — S82851A Displaced trimalleolar fracture of right lower leg, initial encounter for closed fracture: Secondary | ICD-10-CM | POA: Diagnosis not present

## 2023-06-09 DIAGNOSIS — S8251XD Displaced fracture of medial malleolus of right tibia, subsequent encounter for closed fracture with routine healing: Secondary | ICD-10-CM | POA: Diagnosis not present

## 2023-06-09 DIAGNOSIS — S82401D Unspecified fracture of shaft of right fibula, subsequent encounter for closed fracture with routine healing: Secondary | ICD-10-CM | POA: Diagnosis not present

## 2023-06-09 DIAGNOSIS — S82851D Displaced trimalleolar fracture of right lower leg, subsequent encounter for closed fracture with routine healing: Secondary | ICD-10-CM | POA: Diagnosis not present
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
## 2023-06-15 ENCOUNTER — Encounter: Payer: Self-pay | Admitting: Family Medicine

## 2023-07-03 ENCOUNTER — Other Ambulatory Visit: Payer: Self-pay | Admitting: Family Medicine

## 2023-07-16 ENCOUNTER — Ambulatory Visit

## 2023-07-19 ENCOUNTER — Ambulatory Visit: Admitting: Family Medicine

## 2023-07-21 ENCOUNTER — Ambulatory Visit
Admission: RE | Admit: 2023-07-21 | Discharge: 2023-07-21 | Disposition: A | Discharge status: Home / Self Care | Source: Ambulatory Visit | Attending: Family Medicine | Admitting: Family Medicine

## 2023-07-21 DIAGNOSIS — Z0000 Encounter for general adult medical examination without abnormal findings: Secondary | ICD-10-CM

## 2023-07-21 DIAGNOSIS — Z1231 Encounter for screening mammogram for malignant neoplasm of breast: Secondary | ICD-10-CM | POA: Diagnosis not present

## 2023-07-26 ENCOUNTER — Other Ambulatory Visit: Payer: Self-pay | Admitting: Family Medicine

## 2023-07-26 ENCOUNTER — Telehealth: Payer: Self-pay

## 2023-07-26 DIAGNOSIS — R928 Other abnormal and inconclusive findings on diagnostic imaging of breast: Secondary | ICD-10-CM

## 2023-07-26 MED ORDER — LORAZEPAM 0.5 MG PO TABS: 0.5000 mg | ORAL_TABLET | Freq: Two times a day (BID) | ORAL | 0 refills | Status: DC | PRN

## 2023-07-26 NOTE — Telephone Encounter (Signed)
Spoke with patient regarding results. Answered questions

## 2023-07-26 NOTE — Telephone Encounter (Signed)
 Patient calls nurse line requesting to speak with PCP.   She reports she would like to discuss her mammogram.   She is upset as no one called her to discuss results. She reports the breast center called to schedule additional testing. She reports she was not aware anything was wrong.   Advised will forward to PCP.

## 2023-07-28 ENCOUNTER — Ambulatory Visit

## 2023-07-28 ENCOUNTER — Ambulatory Visit
Admission: RE | Admit: 2023-07-28 | Discharge: 2023-07-28 | Disposition: A | Discharge status: Home / Self Care | Source: Ambulatory Visit | Attending: Family Medicine

## 2023-07-28 DIAGNOSIS — R928 Other abnormal and inconclusive findings on diagnostic imaging of breast: Secondary | ICD-10-CM | POA: Diagnosis not present

## 2023-08-06 ENCOUNTER — Encounter

## 2023-08-06 ENCOUNTER — Other Ambulatory Visit

## 2023-09-10 ENCOUNTER — Other Ambulatory Visit: Payer: Self-pay | Admitting: Family Medicine

## 2023-10-08 ENCOUNTER — Other Ambulatory Visit: Payer: Self-pay | Admitting: Family Medicine

## 2023-10-12 ENCOUNTER — Other Ambulatory Visit: Payer: Self-pay

## 2023-10-13 ENCOUNTER — Other Ambulatory Visit: Payer: Self-pay

## 2023-10-13 NOTE — Telephone Encounter (Signed)
 Needs appt

## 2023-10-14 NOTE — Telephone Encounter (Signed)
 LVM for patient to call and schedule visit for refills.  Rosaline JONELLE Pesa, CMA

## 2023-10-15 ENCOUNTER — Other Ambulatory Visit: Payer: Self-pay

## 2023-10-18 NOTE — Telephone Encounter (Signed)
 Received VM from pharmacist regarding rx refill request.   Per notes, patient needs appointment.   Called patient and LVM for patient to return call to schedule appointment.   Called back to pharmacist and advised that patient needs office visit.   Carmen JAYSON English, RN

## 2023-11-05 ENCOUNTER — Ambulatory Visit: Admitting: Family Medicine

## 2023-11-05 ENCOUNTER — Encounter: Payer: Self-pay | Admitting: Family Medicine

## 2023-11-05 VITALS — BP 148/78 | HR 85 | Ht 65.0 in | Wt 190.4 lb

## 2023-11-05 DIAGNOSIS — G4700 Insomnia, unspecified: Secondary | ICD-10-CM | POA: Diagnosis not present

## 2023-11-05 DIAGNOSIS — F411 Generalized anxiety disorder: Secondary | ICD-10-CM | POA: Diagnosis not present

## 2023-11-05 DIAGNOSIS — I10 Essential (primary) hypertension: Secondary | ICD-10-CM

## 2023-11-05 MED ORDER — ESCITALOPRAM OXALATE 5 MG PO TABS: 5.0000 mg | ORAL_TABLET | Freq: Every day | ORAL | 0 refills | Status: AC

## 2023-11-05 MED ORDER — OLMESARTAN MEDOXOMIL 20 MG PO TABS: 20.0000 mg | ORAL_TABLET | Freq: Every day | ORAL | 0 refills | Status: DC

## 2023-11-05 NOTE — Assessment & Plan Note (Addendum)
 Remains uncontrolled. Suspect GAD scoring lower than actual. Discussed maintence therapy, patient remains markedly hesitant to start but agreeable, will start low dose lexapro . Continues on prn BZD. Encouraged counseling. F/u 2 wks.

## 2023-11-05 NOTE — Progress Notes (Signed)
    SUBJECTIVE:   CHIEF COMPLAINT / HPI:    Hypertension: - Medications: losartan  - Compliance: not taking, made constipated - Checking BP at home: no - Diet: avoiding sweets. - Exercise: planning to do classes at [employer], doing more stairs.  Anxiety, Insomnia - Medications: ativan  prn. Takes THC gummies.  - previously on ambien  for insomnia. Previously tried zquil, melatonin. - previuosly tried prozac, lithium. Made feel like a zombie. - previously tried nortriptyline , had constipation. - FH of psych illness: mom with anxiety (now passed) - Symptoms: insomnia, jittery, constant worrying - Current stressors: driving, thinking about brother who passed away - Coping mechanisms: keeping busy.  Abnormal pap smear - ASCUS with +HPV. Normal coloposcopy. Recommended to repeat at 1 year, planning for 02/2024    OBJECTIVE:   BP (!) 148/78   Pulse 85   Ht 5' 5 (1.651 m)   Wt 190 lb 6.4 oz (86.4 kg)   LMP 11/24/2014   SpO2 98%   BMI 31.68 kg/m   Gen: well appearing, in NAD Card: RRR Lungs: CTAB Ext: WWP, no edema Psych: Fidgety. No tangential thought process or flight of ideas. Speech nonpressured.  ASSESSMENT/PLAN:   Generalized anxiety disorder Remains uncontrolled. Suspect GAD scoring lower than actual. Discussed maintence therapy, patient remains markedly hesitant to start but agreeable, will start low dose lexapro . Continues on prn BZD. Encouraged counseling. F/u 2 wks.  Insomnia Likely related to underlying anxiety. Start lexapro . Encouraged daily exercise.  Hypertension Remains elevated and on recheck. Constipation with losartan , trial olmesartan . F/u 2 weeks.     Donald CHRISTELLA Lai, DO

## 2023-11-05 NOTE — Assessment & Plan Note (Signed)
 Likely related to underlying anxiety. Start lexapro . Encouraged daily exercise.

## 2023-11-05 NOTE — Assessment & Plan Note (Addendum)
 Remains elevated and on recheck. Constipation with losartan , trial olmesartan . F/u 2 weeks.

## 2023-11-05 NOTE — Patient Instructions (Addendum)
 It was great to see you!  Our plans for today:  - Start the olmesartan  for your blood pressure. Monitor your blood pressure at home and keep a log of your readings. Make sure to be seated for at least 5 minutes prior to testing and not in pain or worked up for the most accurate readings. Bring this log with you to follow up.  - Start the escitalopram  for your anxiety. This should also help with sleep. It can take a few weeks for it to reach it's full effect.  - Come back in 2 weeks for follow up.  Take care and seek immediate care sooner if you develop any concerns.   Dr. Olumide Dolinger    Therapy and Counseling Resources Most providers on this list will take Medicaid. Patients with commercial insurance or Medicare should contact their insurance company to get a list of in network providers.  [facility] (takes children) Location 1: [address], [address] [place], [place] [address] Location 2: [address] [place], [place] [address] [phone]   [facility] (spanish speaking therapist available)(habla espanol)(take medicare and medicaid)  [address] [place], [place], [place] [address], [place]  [email or web address] [phone]  [facility]ing [address] [place], [place] [address] [phone]  [facility]   [address], [address] [place], [place] [address]      [phone]  [facility] (spanish available) [address]  [place], [place] [address] [phone]  [facility] (take [facility] and medicare) [address], [address]  [place], [place] [address]       [phone]     [facility] (virtual only) [phone]  [facility] [address], [place], [place] [phone]  [facility]:  [address] [place] [phone]  [facility]:  [address] [place] [phone]  [facility] (under &  uninsured) [address]   [place] [phone]    [email or web address]    [facility] [phone] [address]  [place]    [phone]  [facility] -[address]     Phone:  [phone]     [facility]-  [address]  [phone]   [facility] [address]      [place], [place] [phone]  [facility]: [address] [place], [place], [place]  [phone]   [facility] (Spanish therapist) [email or web address]  [address] [place]  [address]   [place] [address]    [phone]    [facility]   [address],  [place], [place]  [phone]   [facility]  [address] [place]  [phone]  [facility]  [address] [place], [place]        [phone]  Open Access/Walk In Clinic under & uninsured  [facility]  [address] [place], [place] [phone] Crisis [phone]  [facility] [address],  (Spanish)   [address] , [place]: [phone]) 8:30 - 12; 1 - 2:30  [facility] [employer],  [address] [place], [place]    [phone]):8:30 - 12; 2 - 3PM  [facility] [employer],  [address],  [place]; [phone]):   Mon - Fri 8 AM - 5 PM  [facility] [address] [place]  MWF 12:30 to 3:00 or call to schedule an appointment  [phone]  Specific Provider options Psychology Today  [email or web address]/us  click on find a therapist  enter your zip code left side and select or tailor a therapist for your specific need.   [facility] Provider Directory [email or web address]  (Medicaid)   Follow all drop down to find a provider  Social Support program [facility] [phone] or PhotoS[employer].pl [address], [place], [place] Recovery support and educational   24- Hour Availability:   [facility]  [address] [place], [place] [phone] Crisis [phone]  [facility] [phone]  [facility]  [phone]   [facility]  [phone] (after hours)  Therapeutic Alternative/Mobile Crisis   [phone]  [place]  National Suicide Hotline  406-342-5332 [place]ILY[place])  Call 911 or go to emergency room  [facility]  [phone]);  [place] and [employer]  [phone]); [place], [place], [place], [place], [place], [place], [place]

## 2023-11-30 ENCOUNTER — Telehealth: Payer: Self-pay

## 2023-11-30 NOTE — Telephone Encounter (Signed)
 Patient calls nurse line in regards Losartan .   She reports she was reading where Losartan  and alcohol mixed together could have dangerous side effects.   She reports she has not switched over to Olmesartan  yet. She reports she admits she was not taking Losartan  like I was supposed to. She reports she is going to finish the bottle she has.   Advised will forward to PCP for advisement.

## 2023-12-02 NOTE — Telephone Encounter (Signed)
 Attempted to call patient, however no answer. VML.  Will discuss when she calls back.

## 2023-12-13 ENCOUNTER — Encounter: Payer: Self-pay | Admitting: Radiology

## 2024-01-04 ENCOUNTER — Telehealth: Payer: Self-pay

## 2024-01-04 NOTE — Telephone Encounter (Signed)
 Patient calls nurse line in regards to her blood pressure.   She reports she feels her blood pressure is high. She reports she has not been taking any blood pressure medication whatsoever she admits.   She reports she was given Losartan  in April, however she never took any, however told PCP it made her constipated. She reports she was transitioned to Olmesartan , however she reports she never picked it up from the pharmacy.   She does not have the capability of taking her BP at home.   She denies any headaches, vision changes, dizziness, shortness of breath or chest pains.   Patient scheduled for tomorrow for evaluation.   ED precautions discussed with patient.

## 2024-01-05 ENCOUNTER — Ambulatory Visit: Admitting: Family Medicine

## 2024-01-10 ENCOUNTER — Encounter: Payer: Self-pay | Admitting: Family Medicine

## 2024-01-10 ENCOUNTER — Ambulatory Visit: Admitting: Family Medicine

## 2024-01-10 VITALS — BP 150/90 | HR 77 | Ht 65.0 in | Wt 189.1 lb

## 2024-01-10 DIAGNOSIS — H9313 Tinnitus, bilateral: Secondary | ICD-10-CM | POA: Diagnosis not present

## 2024-01-10 DIAGNOSIS — I10 Essential (primary) hypertension: Secondary | ICD-10-CM

## 2024-01-10 NOTE — Progress Notes (Unsigned)
    SUBJECTIVE:   CHIEF COMPLAINT / HPI:   HTN Patient with HTN, has not taken BP meds for multiple months. Has been practicing healthy lifestyle habits. No CP, SOB, HA, VC, extremity swelling. Patient prefers lifestyle changes over medications.   Ear ringing  Over the past week or so, patient has noticed constant high pitch ringing in her ears. Mostly hears in R ear but also does hear in L ear as well. Mostly R , sometimes left. No ear pain. No drainage. No fever.   PERTINENT  PMH / PSH: HTN, GAD  OBJECTIVE:   BP (!) 150/90   Pulse 77   Ht 5' 5 (1.651 m)   Wt 189 lb 2 oz (85.8 kg)   LMP 11/24/2014   SpO2 98%   BMI 31.47 kg/m   General: Well-appearing. Resting comfortably in room. CV: Normal S1/S2. No extra heart sounds. Warm and well-perfused. Pulm: Breathing comfortably on room air. CTAB. No increased WOB. Skin:  Warm, dry. Psych: Pleasant and appropriate.   ASSESSMENT/PLAN:   Assessment & Plan Hypertension, unspecified type BP elevated on repeat. Asymptomatic at this time. Per patient preference, continue healthy lifestyle habits. Patient understands medications are available to aid in blood pressure control if she desires them. ED precautions discussed.  Tinnitus of both ears No red flag symptoms identified. Normal hearing test bilaterally in office today. Patient with known GAD which may contribute to symptoms. If symptoms persist for 6 months or new concerns arise, may consider additional workup. Supportive tinnitus care provided with AVS.    RTC in 1 month for follow up.   Damien Cassis, MD [facility]

## 2024-01-10 NOTE — Patient Instructions (Signed)
 Thank you for visiting clinic today and allowing us  to participate in your care!  Your blood pressure is higher than where we would like it to be. As discussed, you can continue making healthy lifestyle changes as you have been to try and lower your blood pressure. If you become interested in blood pressure medications, please let us  know.   Please see the attached for home care tips for ear ringing.   Please schedule an appointment in 1 month to follow up on your blood pressure and ear ringing.   Reach out any time with any questions or concerns you may have - we are here for you!  Damien Cassis, MD [facility] [phone]

## 2024-01-11 NOTE — Assessment & Plan Note (Signed)
 BP elevated on repeat. Asymptomatic at this time. Per patient preference, continue healthy lifestyle habits. Patient understands medications are available to aid in blood pressure control if she desires them. ED precautions discussed.

## 2024-02-17 NOTE — Progress Notes (Signed)
" ° °  SUBJECTIVE:   CHIEF COMPLAINT / HPI:   Discussed the use of AI scribe software for clinical note transcription with the patient, who gave verbal consent to proceed.  History of Present Illness Carmen Garcia is a 59 year old female with hypertension who presents for a follow-up visit.  Hypertension and blood pressure monitoring - Never took prescribed antihypertensive medication, on December 1 visit elected to pursue lifestyle management - Having difficulty using arm BP cuff, inquiring about wrist cuffs as father has one. - Concern about possible side effects of blood pressure medications - Family history of hypertension and diabetes - Father recently required a pacemaker - Intermittent dizziness, thinks associated with exposure to strong perfume - No associated headaches or visual changes  Lifestyle modification and weight loss - Lost 7 pounds in the past month by avoiding sugar, alcohol, and most cigarettes, and by eating healthier - Exercises regularly and tracks heart rate, which is approximately 79 bpm when relaxed at home - Currently smokes about three cigarettes nightly - Reduced alcohol intake - Motivated to improve health  Sleep disturbance and anxiety - Sleep duration improved to 6 to 7.5 hours per night from previous 4 hours - Persistent worry about heart rate and blood pressure - History of anxiety; discontinued medication and now uses breathing and cognitive strategies, which have been helpful - Declines PHQ/GAD today  Hemorrhoidal bleeding and pain - Painful hemorrhoid with bleeding during bowel movements, even with soft stool - Previous use of topical pads, but current episode is more severe than prior occurrences  Cervical cancer screening - due for updated PAP smear. Last one 02/2023 with ASCUS with +HPV,  normal colposcopy.   OBJECTIVE:   BP (!) 148/80   Pulse 85   Ht 5' 5 (1.651 m)   Wt 182 lb 6.4 oz (82.7 kg)   LMP 11/24/2014   SpO2 100%   BMI  30.35 kg/m   Gen: well appearing, in NAD Card: RRR Lungs: CTAB GYN:  External genitalia within normal limits.  Vaginal mucosa pink, moist, normal rugae.  Nonfriable cervix without lesions, no discharge or bleeding noted on speculum exam.  Bimanual exam revealed normal, nongravid uterus.  No cervical motion tenderness. No adnexal masses bilaterally.   Rectal exam: nonthrombosed external hemorrhoids noted with tenderness on palpation. Ext: WWP, no edema  ASSESSMENT/PLAN:   Hypertension Elevated, improved somewhat on recheck but remains elevated. Discussed risks of uncontrolled hypertension. Recommended olmesartan . - Prescribed olmesartan  at a low dose. - Recheck blood pressure in two weeks. Obtain cuff, monitor at home, f/u with log. - Monitor labs in two weeks.  Generalized anxiety disorder Subjectively improved off meds with improved sleep with appropriate coping mechanisms. Continue to monitor.  Atypical squamous cells of undetermined significance (ASCUS) on Papanicolaou smear of cervix Repeat PAP performed today.  External hemorrhoid Painful hemorrhoids with occasional bleeding, nonthrombosed and no active bleeding on exam. No severe constipation. - Recommended sitz baths with witch hazel. - steroid suppository   Insomnia Improved sleep duration, averaging over six hours per night. Lifestyle changes and anxiety coping mechanisms contributing to improved sleep quality. - Continue current lifestyle modifications to support sleep.   F/u 2 weeks.    Donald CHRISTELLA Lai, DO "

## 2024-02-18 ENCOUNTER — Encounter: Payer: Self-pay | Admitting: Family Medicine

## 2024-02-18 ENCOUNTER — Other Ambulatory Visit (HOSPITAL_COMMUNITY)
Admission: RE | Admit: 2024-02-18 | Discharge: 2024-02-18 | Disposition: A | Source: Ambulatory Visit | Attending: Family Medicine | Admitting: Family Medicine

## 2024-02-18 ENCOUNTER — Telehealth: Payer: Self-pay

## 2024-02-18 ENCOUNTER — Ambulatory Visit: Admitting: Family Medicine

## 2024-02-18 VITALS — BP 148/80 | HR 85 | Ht 65.0 in | Wt 182.4 lb

## 2024-02-18 DIAGNOSIS — G4700 Insomnia, unspecified: Secondary | ICD-10-CM | POA: Diagnosis not present

## 2024-02-18 DIAGNOSIS — Z124 Encounter for screening for malignant neoplasm of cervix: Secondary | ICD-10-CM | POA: Diagnosis present

## 2024-02-18 DIAGNOSIS — K644 Residual hemorrhoidal skin tags: Secondary | ICD-10-CM

## 2024-02-18 DIAGNOSIS — I10 Essential (primary) hypertension: Secondary | ICD-10-CM | POA: Diagnosis not present

## 2024-02-18 DIAGNOSIS — R87610 Atypical squamous cells of undetermined significance on cytologic smear of cervix (ASC-US): Secondary | ICD-10-CM | POA: Diagnosis not present

## 2024-02-18 DIAGNOSIS — F411 Generalized anxiety disorder: Secondary | ICD-10-CM | POA: Diagnosis not present

## 2024-02-18 MED ORDER — HYDROCORTISONE ACETATE 25 MG RE SUPP: 25.0000 mg | Freq: Two times a day (BID) | RECTAL | 0 refills | Status: AC

## 2024-02-18 MED ORDER — OLMESARTAN MEDOXOMIL 20 MG PO TABS: 20.0000 mg | ORAL_TABLET | Freq: Every day | ORAL | 0 refills | Status: AC

## 2024-02-18 NOTE — Assessment & Plan Note (Signed)
 Subjectively improved off meds with improved sleep with appropriate coping mechanisms. Continue to monitor.

## 2024-02-18 NOTE — Assessment & Plan Note (Signed)
 Painful hemorrhoids with occasional bleeding, nonthrombosed and no active bleeding on exam. No severe constipation. - Recommended sitz baths with witch hazel. - steroid suppository

## 2024-02-18 NOTE — Assessment & Plan Note (Signed)
 Repeat PAP performed today.

## 2024-02-18 NOTE — Patient Instructions (Addendum)
 It was great to see you!  Our plans for today:  - Restart the olmesartan , we sent a new prescription to the pharmacy.  - Monitor your blood pressure at home and keep a log of your readings. Make sure to be seated for at least 5 minutes prior to testing and not in pain or worked up for the most accurate readings. Bring this log with you to follow up.  - Use the suppository along with sitz baths, witch hazel pads for the next 1-2 weeks. Let me know if not improving at follow up.  - Come back in 2 weeks.  We are checking some labs today, we will release these results to your MyChart.  Take care and seek immediate care sooner if you develop any concerns.   Dr. Glenroy Crossen

## 2024-02-18 NOTE — Assessment & Plan Note (Signed)
 Elevated, improved somewhat on recheck but remains elevated. Discussed risks of uncontrolled hypertension. Recommended olmesartan . - Prescribed olmesartan  at a low dose. - Recheck blood pressure in two weeks. Obtain cuff, monitor at home, f/u with log. - Monitor labs in two weeks.

## 2024-02-18 NOTE — Assessment & Plan Note (Signed)
 Improved sleep duration, averaging over six hours per night. Lifestyle changes and anxiety coping mechanisms contributing to improved sleep quality. - Continue current lifestyle modifications to support sleep.

## 2024-02-18 NOTE — Telephone Encounter (Signed)
 Patient calls nurse line regarding insurance not covering Anusol  suppositories.   Patient is asking that I send message to Dr. Madelon regarding possible insurance covered alternatives.   Advised patient that she could use OTC hemorrhoid treatments over the weekend.   Will also include pharmacy team.   Chiquita JAYSON English, RN

## 2024-02-21 MED ORDER — HYDROCORTISONE 1 % EX CREA: TOPICAL_CREAM | CUTANEOUS | 0 refills | Status: AC

## 2024-02-22 NOTE — Telephone Encounter (Signed)
 Called and left message for patient concerning alternative medication per Dr. Rumball.  Cena JONELLE Pesa, CMA

## 2024-02-23 LAB — CYTOLOGY - PAP
Chlamydia: NEGATIVE
Comment: NEGATIVE
Comment: NEGATIVE
Comment: NEGATIVE
Comment: NEGATIVE
Comment: NEGATIVE
Comment: NORMAL
Diagnosis: NEGATIVE
Diagnosis: REACTIVE
HPV 16: NEGATIVE
HPV 18 / 45: NEGATIVE
High risk HPV: POSITIVE — AB
Neisseria Gonorrhea: NEGATIVE
Trichomonas: NEGATIVE

## 2024-02-24 ENCOUNTER — Ambulatory Visit: Payer: Self-pay | Admitting: Family Medicine
